# Patient Record
Sex: Female | Born: 1935 | Race: White | Hispanic: No
Health system: Southern US, Community
[De-identification: ages and names within clinical notes are randomized; demographics above are authoritative.]

---

## 1998-03-18 ENCOUNTER — Inpatient Hospital Stay (HOSPITAL_COMMUNITY): Admission: EM | Admit: 1998-03-18 | Discharge: 1998-03-24 | Payer: Self-pay | Admitting: Emergency Medicine

## 1998-03-18 ENCOUNTER — Encounter: Payer: Self-pay | Admitting: Emergency Medicine

## 1998-03-19 ENCOUNTER — Encounter: Payer: Self-pay | Admitting: *Deleted

## 1999-02-11 ENCOUNTER — Encounter: Payer: Self-pay | Admitting: Emergency Medicine

## 1999-02-11 ENCOUNTER — Inpatient Hospital Stay (HOSPITAL_COMMUNITY): Admission: EM | Admit: 1999-02-11 | Discharge: 1999-02-12 | Payer: Self-pay | Admitting: Emergency Medicine

## 1999-05-31 ENCOUNTER — Encounter: Payer: Self-pay | Admitting: *Deleted

## 1999-05-31 ENCOUNTER — Inpatient Hospital Stay (HOSPITAL_COMMUNITY): Admission: EM | Admit: 1999-05-31 | Discharge: 1999-05-31 | Payer: Self-pay | Admitting: Emergency Medicine

## 1999-06-01 ENCOUNTER — Encounter: Payer: Self-pay | Admitting: Cardiology

## 1999-06-01 ENCOUNTER — Emergency Department (HOSPITAL_COMMUNITY): Admission: EM | Admit: 1999-06-01 | Discharge: 1999-06-01 | Payer: Self-pay | Admitting: Emergency Medicine

## 1999-11-26 ENCOUNTER — Encounter: Payer: Self-pay | Admitting: Orthopedic Surgery

## 1999-11-28 ENCOUNTER — Inpatient Hospital Stay (HOSPITAL_COMMUNITY): Admission: RE | Admit: 1999-11-28 | Discharge: 1999-12-03 | Payer: Self-pay | Admitting: Orthopedic Surgery

## 2000-12-25 ENCOUNTER — Encounter (HOSPITAL_BASED_OUTPATIENT_CLINIC_OR_DEPARTMENT_OTHER): Payer: Self-pay | Admitting: Internal Medicine

## 2000-12-25 ENCOUNTER — Encounter: Admission: RE | Admit: 2000-12-25 | Discharge: 2000-12-25 | Payer: Self-pay | Admitting: Internal Medicine

## 2001-04-23 ENCOUNTER — Inpatient Hospital Stay (HOSPITAL_COMMUNITY): Admission: EM | Admit: 2001-04-23 | Discharge: 2001-04-26 | Payer: Self-pay | Admitting: Emergency Medicine

## 2001-04-23 ENCOUNTER — Encounter: Payer: Self-pay | Admitting: Emergency Medicine

## 2001-04-24 ENCOUNTER — Encounter: Payer: Self-pay | Admitting: *Deleted

## 2002-04-27 ENCOUNTER — Inpatient Hospital Stay (HOSPITAL_COMMUNITY): Admission: EM | Admit: 2002-04-27 | Discharge: 2002-05-07 | Payer: Self-pay | Admitting: Emergency Medicine

## 2002-04-27 ENCOUNTER — Encounter (HOSPITAL_BASED_OUTPATIENT_CLINIC_OR_DEPARTMENT_OTHER): Payer: Self-pay | Admitting: Internal Medicine

## 2002-04-27 ENCOUNTER — Encounter: Payer: Self-pay | Admitting: General Surgery

## 2002-04-27 ENCOUNTER — Encounter: Admission: RE | Admit: 2002-04-27 | Discharge: 2002-04-27 | Payer: Self-pay | Admitting: Internal Medicine

## 2002-04-28 ENCOUNTER — Encounter: Payer: Self-pay | Admitting: General Surgery

## 2002-04-28 ENCOUNTER — Encounter (INDEPENDENT_AMBULATORY_CARE_PROVIDER_SITE_OTHER): Payer: Self-pay | Admitting: *Deleted

## 2002-05-31 ENCOUNTER — Ambulatory Visit (HOSPITAL_COMMUNITY): Admission: RE | Admit: 2002-05-31 | Discharge: 2002-05-31 | Payer: Self-pay | Admitting: General Surgery

## 2002-05-31 ENCOUNTER — Encounter: Payer: Self-pay | Admitting: General Surgery

## 2002-11-16 ENCOUNTER — Emergency Department (HOSPITAL_COMMUNITY): Admission: EM | Admit: 2002-11-16 | Discharge: 2002-11-16 | Payer: Self-pay | Admitting: Emergency Medicine

## 2003-06-15 ENCOUNTER — Inpatient Hospital Stay (HOSPITAL_COMMUNITY): Admission: EM | Admit: 2003-06-15 | Discharge: 2003-06-22 | Payer: Self-pay | Admitting: Emergency Medicine

## 2004-12-31 ENCOUNTER — Inpatient Hospital Stay (HOSPITAL_COMMUNITY): Admission: RE | Admit: 2004-12-31 | Discharge: 2005-01-01 | Payer: Self-pay | Admitting: Cardiovascular Disease

## 2005-05-10 ENCOUNTER — Emergency Department (HOSPITAL_COMMUNITY): Admission: EM | Admit: 2005-05-10 | Discharge: 2005-05-11 | Payer: Self-pay | Admitting: Emergency Medicine

## 2005-11-26 ENCOUNTER — Inpatient Hospital Stay (HOSPITAL_COMMUNITY): Admission: AD | Admit: 2005-11-26 | Discharge: 2005-11-29 | Payer: Self-pay | Admitting: Internal Medicine

## 2005-11-26 ENCOUNTER — Encounter (HOSPITAL_BASED_OUTPATIENT_CLINIC_OR_DEPARTMENT_OTHER): Payer: Self-pay | Admitting: Internal Medicine

## 2005-11-27 ENCOUNTER — Encounter (INDEPENDENT_AMBULATORY_CARE_PROVIDER_SITE_OTHER): Payer: Self-pay | Admitting: *Deleted

## 2005-11-29 ENCOUNTER — Encounter (INDEPENDENT_AMBULATORY_CARE_PROVIDER_SITE_OTHER): Payer: Self-pay | Admitting: *Deleted

## 2006-09-24 ENCOUNTER — Inpatient Hospital Stay (HOSPITAL_COMMUNITY): Admission: EM | Admit: 2006-09-24 | Discharge: 2006-09-26 | Payer: Self-pay | Admitting: *Deleted

## 2007-04-29 ENCOUNTER — Ambulatory Visit (HOSPITAL_COMMUNITY): Admission: RE | Admit: 2007-04-29 | Discharge: 2007-04-29 | Payer: Self-pay | Admitting: Internal Medicine

## 2009-12-22 ENCOUNTER — Ambulatory Visit (HOSPITAL_COMMUNITY)
Admission: RE | Admit: 2009-12-22 | Discharge: 2009-12-22 | Payer: Self-pay | Source: Home / Self Care | Admitting: Internal Medicine

## 2010-03-21 ENCOUNTER — Ambulatory Visit (HOSPITAL_BASED_OUTPATIENT_CLINIC_OR_DEPARTMENT_OTHER)
Admission: RE | Admit: 2010-03-21 | Discharge: 2010-03-21 | Disposition: A | Discharge status: Home / Self Care | Payer: PRIVATE HEALTH INSURANCE | Attending: Orthopedic Surgery | Admitting: Orthopedic Surgery

## 2010-03-21 DIAGNOSIS — I252 Old myocardial infarction: Secondary | ICD-10-CM | POA: Insufficient documentation

## 2010-03-21 DIAGNOSIS — J449 Chronic obstructive pulmonary disease, unspecified: Secondary | ICD-10-CM | POA: Insufficient documentation

## 2010-03-21 DIAGNOSIS — I10 Essential (primary) hypertension: Secondary | ICD-10-CM | POA: Insufficient documentation

## 2010-03-21 DIAGNOSIS — J4489 Other specified chronic obstructive pulmonary disease: Secondary | ICD-10-CM | POA: Insufficient documentation

## 2010-03-21 DIAGNOSIS — G5620 Lesion of ulnar nerve, unspecified upper limb: Secondary | ICD-10-CM | POA: Insufficient documentation

## 2010-03-21 DIAGNOSIS — E039 Hypothyroidism, unspecified: Secondary | ICD-10-CM | POA: Insufficient documentation

## 2010-03-21 DIAGNOSIS — I4891 Unspecified atrial fibrillation: Secondary | ICD-10-CM | POA: Insufficient documentation

## 2010-03-21 DIAGNOSIS — E119 Type 2 diabetes mellitus without complications: Secondary | ICD-10-CM | POA: Insufficient documentation

## 2010-03-21 DIAGNOSIS — G5600 Carpal tunnel syndrome, unspecified upper limb: Secondary | ICD-10-CM | POA: Insufficient documentation

## 2010-03-21 LAB — POCT I-STAT, CHEM 8
Calcium, Ion: 1.36 mmol/L — ABNORMAL HIGH (ref 1.12–1.32)
Chloride: 105 mEq/L (ref 96–112)
HCT: 43 % (ref 36.0–46.0)
Potassium: 4.2 mEq/L (ref 3.5–5.1)
Sodium: 138 mEq/L (ref 135–145)

## 2010-03-21 LAB — GLUCOSE, CAPILLARY: Glucose-Capillary: 99 mg/dL (ref 70–99)

## 2010-04-09 NOTE — Op Note (Signed)
NAMEJOANNE, SALAH                  ACCOUNT NO.:  [id number]  MEDICAL RECORD NO.:  [id number]          PATIENT TYPE:  AMB  LOCATION:  DSC                          FACILITY:  [facility]  PHYSICIAN:  Cindee Salt, M.D.       DATE OF BIRTH:  07/16/1910  DATE OF PROCEDURE:  03/21/2010 DATE OF DISCHARGE:                              OPERATIVE REPORT   PREOPERATIVE DIAGNOSIS:  Carpal tunnel, cubital tunnel syndrome right arm, right hand.  POSTOPERATIVE DIAGNOSIS:  Carpal tunnel, cubital tunnel syndrome right arm, right hand.  OPERATION:  Decompression median ulnar nerve at the wrist and elbow.  SURGEON:  Cindee Salt, MD  ANESTHESIA:  Supraclavicular block.  ANESTHESIOLOGIST:  Zenon Mayo, MD  HISTORY:  The patient is a 77 year old female with a history of numbness and tingling of her right arm.  She has undergone nerve conductions revealing an ulnar neuropathy at her elbow and a carpal tunnel syndrome with significant changes in both areas at her wrist and elbow.  She has elected to undergo surgical decompression.  Pre, peri, and postoperative course have been discussed along with risks and complications.  She is aware that there is no guarantee with surgery, possibility of infection, recurrence injury to arteries, nerves, tendons, incomplete relief of symptoms, and dystrophy.  In the preoperative area, the patient is seen, the extremity marked by both the patient and surgeon.  Antibiotic given.  PROCEDURE:  The patient was brought to the operating room where a supraclavicular block was carried out without difficulty.  She was prepped using ChloraPrep, supine position with right arm free.  Time-out taken confirming the patient and procedure.  DVT precautions were taken. After adequate anesthesia was afforded, the limb was exsanguinated with an Esmarch bandage.  Tourniquet placed high and the arm was inflated to 250 mmHg.  A longitudinal incision was made in the palm, carried  down through subcutaneous tissue.  Bleeders were electrocauterized with bipolar.  Palmar fascia was split.  Superficial palmar arch identified. The flexor tendon to the ring and little finger identified to the ulnar side of median nerve.  The carpal retinaculum was incised with sharp dissection.  Right-angle and Sewall retractor were placed between skin and forearm fascia.  Fascia was released for approximately 1.5 cm proximal to the wrist crease under direct vision.  Canal was explored. Area compression to the nerve was apparent.  Tenosynovium tissue was mildly thickened.  The wound was copiously irrigated with saline.  The skin closed with interrupted 5-0 Vicryl Rapide sutures.  A separate incision was then made over the medial epicondyle, right elbow, carried down through subcutaneous tissue.  Bleeders again electrocauterized with bipolar.  Dissection carried down to the medial epicondyle.  The posterior aspect of Osborne's fascia was then released revealing the ulnar nerve.  A fasciotomy was then performed to the flexor carpi ulnaris muscle.  After the subcutaneous tissue was dissected free, 2 knee retractors were placed allowing visualization.  The fascia split, the muscle was then split with blunt dissection.  A KMI carpal tunnel release retractor placed between the ulnar nerve and the superficial fascia and  the fascia over the nerve was then released with angled ENT scissors.  This released at approximately 6 cm distally.  The dissection was then carried proximally, again the subcutaneous tissue was dissected free from the underlying fascia.  The two knee retractors placed allowing visualization of the fascia.  A fasciotomy was then performed proximally including the deep fascia with protection with a KMI retractor.  The nerve was released proximally and distally to the level of the arcade of Struthers proximally.  The elbow was placed through full flexion.  No subluxation to the  nerve was apparent.  The wound was copiously irrigated with saline.  Osborne's fascia was then sutured to the posterior skin flap, creating a barrier to anterior subluxation. This was performed with 2-0 Vicryl sutures.  The remaining subcutaneous tissue was closed with interrupted 2-0 Vicryl and the skin with subcuticular 4-0 Vicryl Rapide suture.  A sterile compressive dressing, long-arm splint with the elbow flexed approximately 30 degrees, wrist in neutral position was applied.  The fingers were left free.  On deflation of the tourniquet, all fingers immediately pinked.  She was taken to the recovery room for observation in satisfactory condition.  She will be discharged home to return to the [facility] [place] in 1 week on Ultram.          ______________________________ Cindee Salt, M.D.     GK/MEDQ  D:  03/21/2010  T:  03/22/2010  Job:  [id number]  cc:   Barry Dienes. Eloise Harman, M.D.  Electronically Signed by Cindee Salt M.D. on 04/09/2010 02:24:25 PM

## 2010-05-17 ENCOUNTER — Other Ambulatory Visit (HOSPITAL_BASED_OUTPATIENT_CLINIC_OR_DEPARTMENT_OTHER): Payer: Self-pay | Admitting: Internal Medicine

## 2010-05-17 ENCOUNTER — Ambulatory Visit (HOSPITAL_COMMUNITY)
Admission: RE | Admit: 2010-05-17 | Discharge: 2010-05-17 | Disposition: A | Discharge status: Home / Self Care | Payer: PRIVATE HEALTH INSURANCE | Source: Ambulatory Visit | Attending: Internal Medicine | Admitting: Internal Medicine

## 2010-05-17 DIAGNOSIS — J189 Pneumonia, unspecified organism: Secondary | ICD-10-CM | POA: Insufficient documentation

## 2010-07-03 NOTE — Op Note (Signed)
NAMECATHYANN, KILFOYLE                  ACCOUNT NO.:  [id number]   MEDICAL RECORD NO.:  [id number]          PATIENT TYPE:  INP   LOCATION:  5025                         FACILITY:  [facility]   PHYSICIAN:  Harvie Junior, M.D.   DATE OF BIRTH:  12-Oct-1910   DATE OF PROCEDURE:  09/24/2006  DATE OF DISCHARGE:                               OPERATIVE REPORT   PREOPERATIVE DIAGNOSIS:  Subtrochanteric hip fracture, right.   POSTOPERATIVE DIAGNOSIS:  Subtrochanteric hip fracture, right.   PROCEDURE PERFORMED:  Open reduction/internal fixation of  subtrochanteric hip fracture with a Stryker intramedullary rod, an 11 x  320 mm rod with a 95 mm interlocking screw with static locking  proximally and static locking distally with two interlocking screws.   SURGEON:  Harvie Junior, M.D.   ASSISTANT:  Marshia Ly, P.A.   ANESTHESIA:  General.   BRIEF HISTORY:  Ms. Bui is a 77 year old female with a long history of  having had a fall.  She suffered a subtrochanteric hip fracture.  We  were consulted.  Cardiology clearance was obtained and ultimately led to  her having an okay risk for surgical intervention for a subtrochanteric  hip fracture, which we were consulted for.  We talked about treatment  options and ultimately felt the most appropriate course of action for  her was going to be intramedullary rod fixation for the subtrochanteric  hip fracture.  She was brought to the operating room for this procedure.   PROCEDURE:  The patient was brought to the operating room and adequate  anesthesia was obtained with general anesthetic.  The patient was placed  on the operating room table.  She was then moved to the traction table  and all bony prominences were well padded.  Manipulative closed  reduction was undertaken.  Fluoroscopic images at this point showed an  absolute anatomic reduction of the subtrochanteric fracture and an  intramedullary rod was then placed with a small incision proximal to  the  greater trochanter.  Guide wire was advanced across the fracture.  A  pilot hole was made.  Rod was then reamed to 13.  Rod was then measured  with the measuring device and then the rod was introduced.  Proximal  locking was undertaken and then it was adequately locked.  Once this was  accomplished, the leg was abducted slightly to allow for access to the  distal femur and distal locking was undertaken in a freehand technique  distally in standard AO fashion.  At this point, the wounds were  copiously and thoroughly irrigated and suctioned dry and closed in  layers.  Final fluoroscopic imagings were made, which showed anatomic  alignment.  At this point, the sterile compressive dressings were  applied and the patient was then taken to the recovery room and she was  noted to be in satisfactory condition.  Estimated blood loss throughout  the procedure was less than 200 mL.      Harvie Junior, M.D.  Electronically Signed     JLG/MEDQ  D:  09/24/2006  T:  09/25/2006  Job:  [id number] 

## 2010-07-03 NOTE — Discharge Summary (Signed)
NAMEKEMBER, BOCH                  ACCOUNT NO.:  [id number]   MEDICAL RECORD NO.:  [id number]          PATIENT TYPE:  INP   LOCATION:  5025                         FACILITY:  [facility]   PHYSICIAN:  Barry Dienes. Eloise Harman, M.D.DATE OF BIRTH:  Mar 02, 1910   DATE OF ADMISSION:  09/24/2006  DATE OF DISCHARGE:  09/26/2006                               DISCHARGE SUMMARY   PERTINENT FINDINGS:  The patient is a 77 year old white female with  multiple medical problems.  On the day of admission, she reached for  something while using her lift chair and fell to the floor, suffering  immediate pain in the right hip.  She was transported to the [facility]  emergency room, where a comminuted fracture of the right femur in the  intertrochanteric region was diagnosed.   HOSPITAL COURSE:  She was seen by a cardiology consultant who  recommended perioperative low-dose beta blocker treatment and followed  her closely during her stay.  On September 24, 2006, she had open reduction  and internal fixation of a subtrochanteric right hip fracture, with a  Stryker intramedullary rod and interlocking screw.  She tolerated the  surgery well, with no postoperative complications.  She had mild  hyponatremia that was felt most likely due to moderate hyperglycemia and  chronic use of diuretics.  The possible role of pain was felt possible.  On the day prior to discharge, she was seen by a physical therapy  consultant.  She stood at the end of the bed with a rolling walker and  required total assistance with standing and with transfer to a recliner.   PROCEDURES:  Right hip fracture open reduction with internal fixation.   COMPLICATIONS:  None.   CONDITION ON DISCHARGE:  She has a dry cough but no shortness of breath  on nasal cannula oxygen at 2 liters per minute.  She has a somewhat  decreased appetite and constipation, but no abdominal pain.   MOST RECENT PHYSICAL EXAMINATION:  VITAL SIGNS:  Blood pressure 102/66,  pulse 70, respirations 18, temperature 97, pulse oxygen saturation 98%  on 2 liters per minute of nasal cannula oxygen.  CHEST:  Clear to auscultation.  HEART:  Regular rate and rhythm.  ABDOMEN:  Normal bowel sounds, and mild distention with tympany, but no  tenderness.  EXTREMITIES:  Without cyanosis, clubbing, or edema.  The right lateral  thigh incisions are clean, dry, and intact, showing no signs of  infection.   MOST RECENT LABORATORY TESTS:  White blood cell count 12, hemoglobin  9.7, hematocrit 29, platelets 210.  PT 16.1, INR 1.3.  Serum sodium 128,  potassium 4.8, chloride 95, carbon dioxide 27, BUN 28, creatinine 1.52,  glucose 196.  Most recent BNP test is 233.  Most recent capillary blood  glucose levels have been 308, 233, and 221.   Of note, she feels that her pain control is reasonable on her current  medical regimen.   DISCHARGE DIAGNOSES:  1. Status post fall, with right hip fracture, and open reduction and      internal fixation.  2. Hyponatremia.  3. Anemia.  4. Hyperglycemia secondary to diabetes mellitus type 2.  5. History of congestive heart failure.  6. Chronic atrial fibrillation.  7. Coronary artery disease.  8. Macular degeneration.  9. Seasonal allergic rhinitis.  10.Chronic renal insufficiency.  11.Secondary hyperparathyroidism.  12.Gout.  13.Chronic insomnia.  14.Constipation.  15.Gastroesophageal reflux disease.  16.Vitamin B12 deficiency.  17.Anxiety.   DISCHARGE MEDICATIONS:  1. Coumadin per pharmacist protocol.  Of note, her last dose was 2.5      mg on the evening prior to discharge.  2. Vicodin 5/325, 1 tab p.o. b.i.d.  3. Vicodin 5/325, 1 tab p.o. t.i.d. p.r.n. moderate to severe pain.  4. Tylenol 325 mg p.o. t.i.d. p.r.n. mild pain.  5. Ambien 10 mg p.o. at bedtime p.r.n. sleep.  6. Aspirin 81 mg daily.  7. Lasix 80 mg p.o. q.a.m., 40 mg p.o. q.p.m.  8. Aldactone 25 mg p.o. daily.  9. Allopurinol 300 mg p.o. daily.  10.Lantus  insulin 18 units subcutaneous q.a.m.  11.NovoLog sliding-scale insulin a.c. t.i.d. p.r.n. as follows:  For      fasting blood glucose less than 100, no insulin; for glucose 100-      150, give 2 units NovoLog; 151-200, give 4 units; greater than 200,      give 6 units NovoLog.  12.Claritin 10 mg p.o. daily p.r.n. nasal congestion.  13.Multivitamin 1 tab p.o. daily.  14.Rocaltrol 0.25 mcg p.o. daily.  15.Prilosec 20 mg p.o. daily.  16.Fibercon 625 mg 2 tabs p.o. daily p.r.n. mild constipation.  17.Senna S 2 tabs p.o. daily.  18.MiraLax 17 g p.o. b.i.d.  19.Milk of magnesia 30 mL p.o. daily p.r.n. constipation.  20.Vitamin B12, 1000 mcg IM once monthly.  Hold this month's dose      while on Coumadin.  21.TheraTears 2 drops each eye 3 times daily.  22.Robitussin 15 mL p.o. t.i.d. p.r.n. cough.  23.Nitroglycerin 0.4 mg sublingual p.r.n. chest pain.  24.Phenergan 25 mg p.r. t.i.d. p.r.n. nausea.  25.Dulcolax 10 mg p.r. once daily p.r.n. constipation.  26.Ativan 0.5 mg p.o. b.i.d. p.r.n. anxiety.  27.Betadine swab right hip incision and cover with gauze once daily.   DISPOSITION AND FOLLOWUP:  The patient will be transferred to the  skilled nursing facility for continued rehabilitation after her hip  fracture.  She should have a follow-up visit with Dr. Jodi Geralds in  approximately 2 weeks following discharge.  Attempts should be made to  keep all pressure off of the right heel at all times.           ______________________________  Barry Dienes Eloise Harman, M.D.     DGP/MEDQ  D:  09/26/2006  T:  09/26/2006  Job:  [id number]   cc:   Harvie Junior, M.D.  Richard A. Alanda Amass, M.D.

## 2010-07-06 NOTE — H&P (Signed)
[name], [name]                            ACCOUNT NO.:  [id number]   MEDICAL RECORD NO.:  [id number]                   PATIENT TYPE:  INP   LOCATION:  0445                                 [facility]ACILITY[facility]   PHYSICIAN:  Anselm Pancoast. Zachery Dakins, M.D.          DATE OF BIRTH:  12/02/1910   DATE OF ADMISSION:  04/27/2002  DATE OF DISCHARGE:                                HISTORY & PHYSICAL   CHIEF COMPLAINT:  Nausea and vomiting.   HISTORY OF PRESENT ILLNESS:  [name] is an 77 year old Caucasian  female referred to the [facility] emergency room from Dr.  Ivery Quale for management of her gallstones. The patient states that  about six weeks ago, she had a sore throat and was placed on Penicillin for  about three days. Then she got a significant thrush and since then, has been  on two other antibiotics. But then, she started having nausea and vomiting  and also was having diarrhea. She saw Dr. [name] and with the symptoms,  he referred her for an ultrasound that was done this morning and the  ultrasound, I think it was [facility] at [facility], was read by Dr. Audie Pinto with the  finding of multiple gallstones and distended gallbladder with dilatation of  the intrahepatic ducts and common bile duct to the ampulla. Suggest a common  duct stone and Dr. [name] was called and he suggested that the patient  come to the emergency room and he called [place] to manage the problem. When the  patient arrived to the emergency room, she was accompanied by her daughter  and really was not complaining of significant abdominal pain. She stated  that she is basically quite nauseous over the last several weeks and we  obtained a chest x-ray, EKG, lab studies, etc, and were surprised to see  that her BUN was 75. The patient voided and her urine did not appear that  concentrated. Also, her liver function studies did not show any definite  abnormality. We called [facility]  where she resides and it  appeared that they checked liver function studies about one week ago but  there was no BUN or creatinine and the last one that we can definitely  confirm was about two years ago in Dr. Norval Gable office. With this, I gave  her a liter of normal saline and then a fairly rapid D51/2NS with 20 mEq of  potassium and then now, about six hours later, her creatinine has come down  and her BUN is 69 and she has been getting a lot of thin urine. I think that  it will be okay to proceed with a laparoscopic cholecystectomy in the  morning.   PAST MEDICAL HISTORY:  She says that Dr. Alanda Amass is her Cardiologist and  he did her Pacemaker but she denies angina. Previous abdominal surgery, she  has had a lower midline  incision that did go up above her umbilicus and she  said that this was a partial hysterectomy many years ago.   MEDICATIONS:  Her usual medications are enteric coated aspirin. She is on  Celebrex 100 mg, Lasix 80 mg per day, Digoxin 0.125 mg one half tab daily, K-  Dur, Isosorbide 30 mg one a day, Toprol XL and Plavix 75 mg.   PHYSICAL EXAMINATION:  VITAL SIGNS: In the emergency room her weight is 150  pounds. Blood pressure 127/73, temperature 96.4, heart rate 70, respiratory  rate 18. Height is 5'3.  GENERAL: She is quite cooperative. Does not appear acutely ill.  SKIN: Hydration, does not appear to be as dehydrated as you would expect  with a BUN of 75. Her peripheral veins are somewhat collapsed but she has  not got extremely poor skin turgor.  HEENT: Tongue does not appear to be extremely dry.  CHEST: She has breath sounds bilaterally. Pacemaker.  CARDIAC: Normal sinus rhythm.  ABDOMEN: She is a little bloated but not acutely tender. She has a lower  midline incision. She had a bowel movement yesterday. She says that it has  been liquid but there has not been a chronic problem with constipation.  EXTREMITIES: Lower, she does not have pedal edema.   CNS: Appears physiologic.   IMPRESSION:  1. Symptomatic gallbladder.  2. Dehydration probably secondary to gallbladder and also recent sore throat     and oral antibiotics for the sore throat.  3. Rule out digitalis toxicity.   PLAN:  I have not placed her on antibiotics since her white count is normal  and she is not acutely tender but we are rapidly rehydrating her and I will  repeat a C-met in the morning. If this is improved and her digitalis level  is not in a toxic range, will proceed on with a laparoscopic cholecystectomy  and cholangiogram. I have discussed with her daughter that the x-ray  questioned a common bile duct stone but her liver function studies are not  elevated and hopefully, this is just a kind of a dilated common bile duct,  secondary to her age of 89 years. The patient is certainly cooperative and  understands the planned procedure and has been added onto the OR schedule  for 8:30 a.m., tomorrow morning, Wednesday morning.                                                 Anselm Pancoast. Zachery Dakins, M.D.    WJW/MEDQ  D:  04/27/2002  T:  04/28/2002  Job:  [id number]

## 2010-07-06 NOTE — Discharge Summary (Signed)
[place]. [facility]  Patient:    [name], [name]                         MRN: [id number] Adm. Date:  [id number] Attending:  Milly Jakob Dictator:   Currie Paris. Orma Flaming, P.A. CC:         Richard A. Alanda Amass, M.D.  Barry Dienes Eloise Harman, M.D.   Discharge Summary  ADMISSION DIAGNOSES: 1. End-stage degenerative joint disease, left knee. 2. Coronary artery disease. 3. Hypothyroidism. 4. Chronic atrial fibrillation. 5. Hypertension.  DISCHARGE DIAGNOSES: 1. End-stage degenerative joint disease, left knee. 2. Coronary artery disease. 3. Hypothyroidism. 4. Chronic atrial fibrillation. 5. Hypertension.  PROCEDURES IN HOSPITAL: Left total knee arthroplasty by Jodi Geralds, M.D.  CONSULTATIONS:  Cardiology consult, Dr. Alanda Amass.  BRIEF HISTORY:  [name] is a pleasant, 77 year old, female who we have followed for quite some time with a long history of left knee pain. She has pain with ambulation and positive night pain.  Plain standing x-rays of the left knee showed bone on bone arthritis within the left knee joint.  She got no relief with exhaustive conservative treatment including medication, modification of her activities and multiple cortisone injections.  Since she has not improved with exhaustive conservative treatments she was felt to be a candidate for a left total knee arthroplasty and is admitted for this.  PERTINENT LABORATORY DATA:  The patients hemoglobin on admission is 13.6 and hematocrit 39.1.  On postoperative day one, her hemoglobin was 10.2. Postoperative day two her hemoglobin was 9.9. Postoperative day three hemoglobin was 8.9.  Prothrombin time on admission was 12.6 seconds and INR of 1.0, PTT 25.  On the date of discharge her prothrombin time was 22.1 seconds with INR of 2.8.  This was on Coumadin antithrombotic therapy.  Her CMET on admission was within normal limits other than slightly decreased albumin at 3.4 and elevated glucose  at 126.  This remained stable postoperatively as well.  Her TSH was 0.081.  Free T4 was 1.95 and free T3 was 2.4.  Digoxin level was 0.9.  Urinalysis on admission showed 11 to 20 WBCs per high powered field.  Urine culture showed no growth.  EKG preoperatively showed atrial flutter with a competing junctional pacemaker, left axis deviation, nonspecific intraventricular block.  Chest x-ray preoperatively showed stable cardiomegaly with no active lung disease.  Permanent pacemaker remains.  HOSPITAL COURSE:  The patient was admitted to [facility]  She has preoperatively been worked up from a cardiology view point by Dr. Alanda Amass and was felt to be stable for surgery.  She underwent a left total knee arthroplasty on November 28, 1999 and tolerated the procedure well. Postoperatively she was put on Ancef 1 gm IV q.12h. x48 hours.  PCA morphine pump was used for pain control.  Physical therapy was ordered for walker ambulation, weight bearing as tolerated on the left.  Cardiologist, evaluated the patient and she was felt to be stable.  Preoperatively, the patients Plavix and aspirin were discontinued as she was to be placed on Coumadin postoperatively for DVT prophylaxis.  Postoperatively, she had no significant medical complications whatsoever and she did very nicely.  On postoperative day one she had no complaints.  Her vital signs were stable.  She was afebrile.  Her lungs were clear to auscultation.  Her cardiac status was stable.  Her hemoglobin was 10.2.  Her prothrombin time was 13.7 seconds and INR of 1.2.  Intact left lower extremity.  CPM machine was used for range of motion. On postoperative day two, the patients left knee dressing was changed and her wound was benign.  The calf was soft.  She did have some mild nausea. Her vital signs remained stable.  Her IV was converted to a saline lock and the Foley catheter was kept until postoperative day three.  Her Hemovac  drain was pulled on postoperative day two.  She continued to progress nicely with physical therapy without complication.  She had no medical problems whatsoever during this hospitalization.  She had very much the cardiology assistance.  On postoperative day five, the patient was doing well.  She was without complaint. She was alert and oriented.  She was taking fluids and voiding without difficulty.  She was progressing nicely with physical therapy. Her temperature is 100.6. Vital signs are stable.  Her left knee dressing was clean and dry. Her wound was benign.   Pulse was intact to the left lower extremity.  Her prothrombin time was 22.2 seconds with an INR of 2.8.  She is discharged to [facility] in [place].  DIET:  Regular.  ACTIVITY:  Weight bearing as tolerated on the left with a walker.  We would like to use a CPM machine starting at 0 degrees to 65 degrees increasing 5 to 10 degrees daily until she gets the knee to 90 degrees of flexion.  DISCHARGE MEDICATIONS:  All preoperative medications except for Plavix and aspirin.  We will add Coumadin for DVT prophylaxis, shooting for an INR between 1.5 and 2.0 per pharmacy protocol.  We would like her on Coumadin times one month postoperative.  She will also be given Tylox one tablet q.4-6h. p.r.n. pain and Flexeril 10 mg one q.h.s. p.r.n. for spasm and sleep.  INSTRUCTIONS:  She will need a dressing change of the left knee every other wound and the wound needs to be painted with Betadine.  CONDITION ON DISCHARGE:  Improved.  FOLLOW-UP: She will follow up with Dr. Luiz Blare in ten days and Dr. Alanda Amass at her previously scheduled routine appointment with him.  Notify us should any problems occur. DD:  12/03/99 TD:  12/03/99 Job: [id number]

## 2010-07-06 NOTE — Consult Note (Signed)
[name]                  ACCOUNT NO.:  [id number]   MEDICAL RECORD NO.:  [id number]          PATIENT TYPE:  INP   LOCATION:  4735                         FACILITY:  [facility]   PHYSICIAN:  Alfonse Ras, MD   DATE OF BIRTH:  10-26-1910   DATE OF CONSULTATION:  11/27/2005  DATE OF DISCHARGE:                                   CONSULTATION   CONSULTING SURGEON:  Alfonse Ras, M.D.   REQUESTING PHYSICIAN:  Barry Dienes. Eloise Harman, M.D.   CARDIOLOGIST:  [facility].   REASON FOR CONSULTATION:  Left thyroid mass with supraclavicular enlarged  lymph nodes.   HISTORY OF PRESENT ILLNESS:  [name] is a 77 year old female patient with  history of atrial fibrillation who has refused Coumadin in the past.  She  also has a pacemaker, prior history of CAD and CHF who was admitted on  11/26/2005 due to swelling of the face and neck for 1 week.  The patient  describes the onset about 1 week ago.  Subtle in nature to me.  She denies  any shortness of breath or dysphasia associated with swelling and she has  had no chronic problems related with dysphasia or shortness of breath or  swelling.  She did report to the admitting physician that she had some mild  orthopnea and dyspnea when supine with the worst of the swelling.  She has  undergone an evaluation here because of the facial swelling. CT of the chest  and neck was done and that revealed a 2.4 cm mass and enlarged lymph nodes  in the carotid chain with the largest being a 1.1 x 1.4 cm left infrahyoid  lymph node. Her swelling has decreased since admission and she is in the  process of being worked up for a pulmonary embolus secondary to complaints  of pleuritic chest pain upon presentation. Surgical evaluation has been  requested regarding the thyroid mass.   REVIEW OF SYSTEMS:  As above, otherwise noncontributory.   PAST MEDICAL HISTORY:  1. Atrial fibrillation not on Coumadin.  2. Peripheral neuropathy with  associated ataxia.  3. Gouty arthritis.  4. CHF with EF 45-50%.  5. Dyslipidemia.  6. CAD stable.  7. Anemia.  8. Overactive bladder.  9. Macular degeneration.   PAST SURGICAL HISTORY:  1. Attempted Lap chole with subsequent open cholecystectomy.  2. Appendectomy.  3. Pacemaker.  4. Total abdominal hysterectomy.  5. Total knee replacement.  6. Left hip fracture with ORIF.   SOCIAL HISTORY:  She is a DNR.  She lives at home. No alcohol.  No tobacco.   FAMILY HISTORY:  Noncontributory.   ALLERGIES:  PENICILLIN, CODEINE which causes significant confusion.   CURRENT MEDICATIONS:  Please see the patient's medication reconciliation  sheet for home medications. While hospitalized she is on Tylenol, Omega-3  fatty acids, Lotemax, Restasis eye drops, Senokot, MiraLax, Lyrica, aspirin,  calcitriol Zyloprim, Lasix, spironolactone, Ambien, Artificial tears,  heparin, Lasix, Protonix, p.r.n. nitroglycerin.   PHYSICAL EXAMINATION:  GENERAL:  Pleasant female patient complaining of  recent problems related to facial and neck  swelling. VITAL SIGNS:  Temperature 97.8, BP 120/70, pulse 75 and slightly regular, respirations 18.  NEURO:  The patient is alert, oriented x3 moving all extremities x4.  No  focal deficits.  HEENT:  Head is normocephalic.  There is residual edema noted about the face  and neck, more prominent on the left neck.  She also has a palpable fullness  in the left neck without any appreciable adenopathy or definite mass felt.  This is located just below the angle of the jaw. There is no supraclavicular  adenopathy palpable as well.  CHEST:  Bilateral lung sounds are clear to auscultation respiratory effort  is non-labored.  CARDIAC:  S1-S2 without obvious rubs, murmurs, thrills. No gallops. Due to  the swelling in the face and neck, I am unable to appreciate if there is any  JVD.  ABDOMEN:  Soft, nontender, nondistended without hepatosplenomegaly, masses  or bruits.  She  does have a small soft, nontender easily reducible umbilical  hernia.  EXTREMITIES:  Symmetrical in appearance without significant edema, cyanosis  or clubbing.   LABORATORY:  TSH is 1.266.  White count to 6200, hemoglobin 13, platelet  count 215,000.  PT 14.3 INR 1.1.  Sodium 131, potassium 4.4, CO2 29, glucose  241, BUN 29, creatinine 1.6 calcium is 10.2.   Diagnostic CT of the neck and chest again revealed a 2.4 cm left thyroid  mass with multiple left carotid chain adenopathy with the largest being a  1.1 x 1.4 cm node in the left infrahyoid region. Thyroid ultrasound is  recommended.   IMPRESSION:  1. Thyroid mass with adenopathy.  Normal TSH,  normal calcium.  2. Pulmonary embolus workup in progress.  3. Coronary artery disease, asymptomatic.  4. Congestive heart failure, compensated.  5. Atrial fibrillation not on Coumadin.  6. Hyperglycemia.   PLAN:  After discussion with the patient and daughter. the patient at this  point is declining a surgical intervention to i.e. any thyroidectomy-type  procedure, but she would like a biopsy for nonsurgical treatment.  Radiologist is  recommending at least a thyroid ultrasound and possible needle aspiration.  We will go ahead and order an ultrasound of the thyroid to evaluate to the  thyroid mass and lymph node enlargement and I will discuss with Dr. Colin Benton  whether if as surgeons we will perform these procedures or defer to  Interventional Radiology.      [name]      Alfonse Ras, MD  Electronically Signed    ALE/MEDQ  D:  11/27/2005  T:  11/28/2005  Job:  [id number]

## 2010-07-06 NOTE — Discharge Summary (Signed)
[name], [name]                            ACCOUNT NO.:  [id number]   MEDICAL RECORD NO.:  [id number]                   PATIENT TYPE:  INP   LOCATION:  0343                                 FACILITY:  [facility]   PHYSICIAN:  Anselm Pancoast. Zachery Dakins, M.D.          DATE OF BIRTH:  05/01/1910   DATE OF ADMISSION:  04/27/2002  DATE OF DISCHARGE:                                 DISCHARGE SUMMARY   DISCHARGE DIAGNOSES:  1. Subacute cholecystitis with common duct stone and ascending cholangitis.  2. Severe dehydration secondary to nausea and vomiting.  3. History of coronary artery disease with atrial fibrillation and has     pacemaker.  4. Degenerative arthritis, history of gout.  5. Hyperkalemia.   OPERATION:  Laparoscopic attempt and then conversion to an open  cholecystectomy common duct exploration, T tube and choledochoscopy.   HISTORY:  [name] is an 77 year old Caucasian female who was referred to  the ER at [facility] by  Dr. Jarome Matin, her regular physician, for management of about a six  week period of nausea and vomiting and an ultrasound had been obtained that  showed gallstones. She stated that she had had an infected throat and was  started on antibiotics, I think originally it was Augmentin for about three  days about six weeks earlier and then got into problems with extremely sore  throat thought possibly to have a reaction whether it was a fungal,  secondary or whatever I am not sure but the antibiotics were changed. She  continued to complain of nausea and vomiting and felt that she was getting  weaker and for this she was evaluated with an ultrasound that showed  gallstones. They recommended that she come to the emergency room and I was  on call and saw her in the emergency room and on physical examination I was  surprised in that she was not acutely tender but she was kind of mildly  bloated. On laboratory studies, her white count was not elevated but her  BUN  was 75 with a creatinine of 2.6. Her calcium was elevated at 11.4 and her  electrolytes were normal, potassium was 5.2. She had been on oral supplement  and her hematocrit was 12.7 with a white blood count of 6500. I started  giving her fluid boluses, did place her on Cipro and placed a Foley  catheter. She was given a liter bolus in the emergency room and started  making a moderate amount of urine and I elected not to take her to the  operating room initially because of this marked azotemia which I thought was  probably secondary to basically dehydration. Her chronic medications she had  on Lasix 80 mg a day for her coronary artery problems and pacemaker and this  combined with the nausea, vomiting and basically inability to eat I think it  lead to the significant dehydration. After about six  hours, repeat labs were  obtained, a BUN was 69, creatinine was decreased to 1.9 and this obviously  confirmed that she was hypovolemic. The following morning, her BUN was  continuing to decrease and I elected to proceed on with surgery. Hopefully  we were able to do it with a laparoscope. She had had a hysterectomy years  ago through a very generous lower midline incision that went to the mid  upper abdomen. She did not have any actual details of why her incision was  so high, etc. and I think that had been her only previous abdominal surgery.  However at the time of surgery, she had extensive adhesions throughout  generalized in her abdomen and trying to get into the upper abdomen in a  free space so we could do the laparoscopic gallbladder.  There was a little  injury. The camera touched the small bowel and a little burn area and I  converted her to an open cholecystectomy. She had extensive adhesions, a  very difficult gallbladder because of the adhesions. The gallbladder was  subacutely inflamed, thickened and the T tube cholangiogram showed a very  dilated extrahepatic biliary system. I  could not definitely see a free  stone. The radiologist was not sure whether it was spasm or what and since  we had done an open, I elected to go ahead and do a common duct exploration,  used a biliary Fogarty that kind of pushed through the ampulla area, could  not actually see a definite stone but then we were getting flow of dye into  the duodenum. A T tube was placed and postoperatively she was making an  adequate amount of urine but then had a problem with hyperkalemia and we did  transfer her down to a monitor bed. She was originally not on monitor but  had no cardiac arrhythmias and etc. She had a nasogastric tube for  approximately three days. I had placed a few stitches in the little area  where the small bowel had been basically burned and then she started having  gas, removed the NG tube, started her on a diet and she progressed nicely.  The bile in the common bile duct at the time of surgery was frankly purulent  and the cultures of this grew a Klebsiella pneumonia sensitive to Unasyn and  most of the antibiotics not sensitive to ampicillin. She has done nicely.  Her BUN and creatinine have returned to a normal level. Her creatinine this  morning is 0.8, her BUN is 11, total bilirubin is 0.9. The T tube has been  clamped for 48 hours and her liver tests are slightly abnormal as you  usually see with a T tube in place. Alkaline phosphatase 166, SGOT 69, SGPT  66. Her hemoglobin is 20, hematocrit 25.6, white count 7600 and I think this  is somewhat delusional. I think she is probably a little low hemoglobin that  would appear to be normal on admission just because she was so dehydrated  and this will be followed in the office. I am weighing her at this time and  would recommend that 1) we probably resume the Lasix at about 40 mg either  daily or every other day according to her weight. Her blood pressure has never been significantly elevated and we will see her in the office in  one  week. If she would have fever or reoccurring abdominal pain, the T tube will  be opened and connected to the bile  bag and I will have them check a CMET  probably Tuesday or Wednesday of next week at [facility] and fax  me a report of that and I am planning to see her in the office towards the  end of next with week. How long she is actually going to be in the nursing  home before she returns to her regular home I am not sure, but we plan on  getting a T tube cholangiogram in about 10 days and if the T tube  cholangiogram is normal, I will remove the T tube in the office.                                               Anselm Pancoast. Zachery Dakins, M.D.    WJW/MEDQ  D:  05/07/2002  T:  05/07/2002  Job:  [id number]   cc:   Barry Dienes. Eloise Harman, M.D.  [address]  [place]  [place] [address]  Fax: [phone]   Richard A. Alanda Amass, M.D.  [id number]  [place]  [place] [address]  Fax: [phone]

## 2010-07-06 NOTE — Consult Note (Signed)
[name], [name]                            ACCOUNT NO.:  [id number]   MEDICAL RECORD NO.:  [id number]                   PATIENT TYPE:  INP   LOCATION:  5031                                 FACILITY:  [facility]   PHYSICIAN:  John C. Madilyn Fireman, M.D.                 DATE OF BIRTH:  17-Nov-1910   DATE OF CONSULTATION:  06/21/2003  DATE OF DISCHARGE:                                   CONSULTATION   REASON FOR CONSULTATION:  Elevated liver function tests.   HISTORY OF PRESENT ILLNESS:  The patient is a 77 year old white female who  was admitted with hip fracture and underwent surgery on June 15, 2003.  She  had an ALT of 62, AST of 54 preoperatively, but on June 18, 2003, had an  ALT of 618, AST of 109.9.  On Jun 19, 2003, her ALT and AST were 296 and 393,  respectively; on Jun 21, 2003, they were 247 and 180, respectively, all the  time with normal bilirubin and alkaline phosphatase.  She has not had any  abdominal pain.  She had an abdominal ultrasound which showed a dilated  common bile duct of 17 mm without any obvious mass or stone.  MRCP is  ordered.  The patient did undergo cholecystectomy in April of 2004, and was  noted to have some dilatation of the common bile duct at the time of  intraoperative cholangiogram.  There was some suggestion of retained common  duct stone at that time.  The patient had a T-tube in and one month later,  had a negative T-tube cholangiogram prior to it being pulled.  The patient  denies any personal family history of liver disease, denies any alcohol use.  She is not sure whether she has had blood transfusion in the past and denies  any IV drug use.   PAST MEDICAL HISTORY:  1. Gout.  2. Atrial fibrillation.  3. Congestive heart failure.   PAST SURGICAL HISTORY:  1. Pacemaker implantation.  2. Cholecystectomy.   ALLERGIES:  1. PENICILLIN.  2. CODEINE.   MEDICATIONS:  1. Celebrex.  2. Neurontin.  3. Toprol.  4. Isordil.  5. Allopurinol.  6.  Spironolactone.   PHYSICAL EXAMINATION:  GENERAL APPEARANCE:  A well-developed, elderly white  female in no acute distress.  HEENT:  Sclerae are nonicteric.  LUNGS:  Clear.  CARDIOVASCULAR:  Regular rate and rhythm without murmur.  ABDOMEN:  Soft and nondistended with normal active bowel sounds.  No  hepatosplenomegaly.  No masses or guarding.   IMPRESSION:  1. Elevated liver function tests in the perioperative setting probably due     to surgery related hypoperfusion.  2. Dilated common bile duct with no clinical presentation of biliary     obstruction and doubt this is related to her elevated liver function     tests.   PLAN:  1. Will continue to follow  liver function tests and probably pursue     nonaggressive work-up as long as falling.  2. We will await MRCP to assess for any evidence of retained common duct     stones or periampullary or pancreatic tumor.                                               John C. Madilyn Fireman, M.D.    JCH/MEDQ  D:  06/21/2003  T:  06/21/2003  Job:  [id number]   cc:   Barry Dienes. Eloise Harman, M.D.  [address]  [place]  [place] [address]  Fax: [phone]

## 2010-07-06 NOTE — Discharge Summary (Signed)
[place]. [facility]  Patient:    [name], [name] Visit Number: [id number] MRN: [id number]          Service Type: EMS Location: [place] Attending Physician:  Osvaldo Human Dictated by:   Abelino Derrick, P.A.C. Admit Date:  04/23/2001 Discharge Date: 04/23/2001   CC:         Barry Dienes. Eloise Harman, M.D.   Discharge Summary  DISCHARGE DIAGNOSES: 1. Congestive heart failure, improved at discharge. 2. Mild left ventricular dysfunction with ejection fraction of 45-50%. 3. Coronary disease with total right coronary artery, left-to-right    collaterals. 4. Chronic atrial fibrillation. 5. History of PTVDP. 6. History of hypothyroidism. 7. Hypertension.  HOSPITAL COURSE:  The patient is an 77 year old female with history as noted above, who became short of breath on the day of admission.  She was admitted to telemetry.  She was given IV diuretics.  She improved symptomatically with diuresis.  ACE inhibitor was increased.  She was put on heparin.  We feel she could be discharged April 26, 2001, and will follow up with Dr. Alanda Amass.  DISCHARGE MEDICATIONS:  1. Altace 5 mg a day.  2. Lasix 80 mg twice a day for three days, then decreased to 80 mg in the     morning and 40 mg at night.  3. Potassium 20 mEq twice a day for three days and then once a day.  4. Lanoxin 0.125 mg one-half tablet a day.  5. Celebrex as taken at home.  6. Imdur 30 mg a day.  7. Plavix 75 mg a day.  8. Aspirin 81 mg a day.  9. Toprol XL 25 mg a day. 10. Allopurinol 100 mg a day. 11. Niaspan was added at discharge, 500 mg at h.s.  LABORATORY DATA:  EKG shows atrial fibrillation, ventricular pacing.  Chest x-ray shows cardiomegaly and vascular congestion on April 23, 2001.  Follow-up chest x-ray showed no acute process on March 7.  White count 10.5, hemoglobin 14.5, hematocrit 43.1, platelets 236.  Sodium 138, potassium 3.7, BUN 12, creatinine 0.9.  Liver functions showed an AST of 27,  ALT 15, ALP 141. Hemoglobin a1C is 6.0.  CK and troponins are negative.  Lipid profile showed triglycerides 366, HDL 49, LDL 67.  TSH 1.17.  B12 499.  Urinalysis unremarkable.  DISPOSITION:  The patient is discharged in stable condition.  She will get BNP Thursday after discharge and will get an echo and follow up with Dr. Alanda Amass.  Her admission weight was 164, discharge weight 156. Dictated by:   Abelino Derrick, P.A.C. Attending Physician:  Osvaldo Human DD:  04/30/01 TD:  05/02/01 Job: [id number]

## 2010-07-06 NOTE — Discharge Summary (Signed)
[name], [name]                  ACCOUNT NO.:  [id number]   MEDICAL RECORD NO.:  [id number]          PATIENT TYPE:  INP   LOCATION:  4735                         FACILITY:  [facility]   PHYSICIAN:  Barry Dienes. Eloise Harman, M.D.DATE OF BIRTH:  06-14-1910   DATE OF ADMISSION:  11/26/2005  DATE OF DISCHARGE:  11/29/2005                                 DISCHARGE SUMMARY   PERTINENT FINDINGS:  Patient is a 77 year old white female who has been a  long-term resident of the [facility].  She has multiple  medical problems.  Approximately 3 days ago, she was noted to have left neck  and supraclavicular fullness that progressed over the next 2 days to also  involve the right side.  The swelling seemed to be worse when lying supine  and was associated with mild dyspnea and mild right-sided pleuritic chest  pain.  She had not had any recent fever or chills.  She has no history of  cancer.  She does have a history of a cardiac pacemaker that was last  changed approximately 1 year ago.  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  She also has a vague  history of diabetes mellitus, type 2.   With a description of bilateral neck swelling with shortness of breath, she  was empirically started on Lovenox 40 mg subcutaneous every 12 hours on  November 23, 2005.  She was seen at the facility on October 9, and directly  admitted to the hospital.   See admission history and physical for listing of her medications prior to  admission, allergies and medication intolerances, past surgical history,  social history, family history, and review of systems.   INITIAL PHYSICAL EXAMINATION:  VITAL SIGNS:  Blood pressure 110/67, pulse  69, respirations 20, temperature 97.6.  GENERAL:  In general, she is an elderly white female who had mild facial  plethora while sitting in a wheelchair.  HEAD, EYES, EARS, NOSE AND THROAT:  Exam was otherwise unremarkable.  NECK:  Neck had a normal range of motion with  suggestive of bilateral  supraclavicular fullness and no cervical lymphadenopathy or no thyromegaly.  There was no carotid bruit and jugular venous was difficult to discern.  CHEST:  Chest had minimal bibasilar crackles.  HEART:  Heart had a regular rate and rhythm without significant murmur or  gallop.  ABDOMEN:  Abdomen had normal bowel sounds and no hepatosplenomegaly or  tenderness.  EXTREMITIES:  Extremities had bilateral 1+ feet edema that was slightly  greater on the left than the right with no evidence of a palpable venous  cord.  NEUROLOGICAL EXAM:  She was alert and well-oriented and continues to have  bilateral numbness of the legs and feet.   Most recent laboratory data included November 24, 2005, labs:  White blood  cell count 11.7  with 72% neutrophils, hemoglobin 13, hematocrit 36,  platelets 246.  Serum sodium 134, potassium 4.3, chloride 97, carbon dioxide  28, BUN 30, creatinine 1.5, glucose 151, total protein 6.8, albumin 2.9.   October 24, 2005, chest x-ray showed the following:  Cardiomegaly with  calcification and ectasia of the aorta, along with central venous  engorgement with no peripheral signs of pulmonary edema, focal infiltrate,  or atelectasis.   HOSPITAL COURSE:  The patient was admitted to a medical bed with telemetry.  On the day of admission, she had a CT scan of the neck and chest with IV  contrast that showed the following:  1) No intrathoracic mass or  lymphadenopathy with chronic interstitial prominence in both lungs, mild  cardiomegaly, and a cardiac pacemaker in the appropriate location.  The neck  scan  showed a 1.1 x 1.4-cm lymph node that was lateral to the left internal  carotid artery and 2.4 x 2.1-cm left thyroid mass with internal  calcifications.  She also had a bilateral upper extremity ultrasound exam on  October 10 that showed no evidence of venous thrombosis.  A ventilation and  perfusion scan done due to mild renal insufficiency showed low probability  for pulmonary embolism.  An October 11 thyroid and neck ultrasound showed a  lower left thyroid 1.9 cm x 1.6 cm x 1.7-cm heterogenous nodule with some  internal calcifications, for which biopsy was recommended.  An October 11  chest x-ray showed cardiomegaly with interstitial prominence.  On the day of  discharge, she is to have a fine-needle aspirate biopsy of the left neck  lymphadenopathy and thyroid mass.   She was also seen by her cardiologist and by a general surgeon.  The  cardiologist had recommended doing the upper extremity Doppler exam.  They  noted that her advanced age increased her risks for surgery.  A general  surgeon evaluated the patient and recommended biopsy of the lesion.  She  declined an open biopsy, preferring use of interventional radiology.   CONDITION ON DISCHARGE:  She had no shortness of breath and continued to  have very mild right-sided pleuritic chest pain, but no substernal chest  discomfort.   VITAL SIGNS: Most recent vital signs include a blood pressure of 100/73,  pulse 70, respirations 18, temperature 97.8, pulse oxygen saturation 94% on  room air.  GENERAL:  Her current exam shows a mildly overweight white female who was in  no apparent distress while sitting at 30 degree elevation of head of the  bed.  HEAD, EYES, EARS, NOSE AND THROAT:  Exam was within normal limits and there  was no evidence of cervical lymphadenopathy by exam.  NECK:  Without jugular venous distention or carotid bruit.  CHEST:  Clear to auscultation. HEART:  Heart had a regular rate and rhythm without significant  murmur or  gallop.  ABDOMEN: Benign.  EXTREMITIES:  Extremities had bilateral trace leg edema.  NEUROLOGIC:  She was alert and well-oriented.   Most recent laboratory tests include white blood cell count 6.6, hemoglobin  13, hematocrit 40, platelets 213.  Serum sodium 136, potassium 4.9, chloride  98, carbon dioxide 29, BUN 28, creatinine 1.5, glucose 138.  BNP 31.  TSH  1.3.  Hemoglobin A1c 7.1%.  Most recent capillary blood glucose levels on  sliding scale insulin were 150, 171, 159.  Her current weight is 182 pounds.   DISCHARGE DIAGNOSES:  1. Left-sided thyromegaly, rule out thyroid carcinoma.  2. Left-sided supraclavicular lymphadenopathy.  3.  Chronic atrial fibrillation.  4. Cardiac pacemaker dependent.  5. Idiopathic bilateral leg neuropathy with secondary ataxia.  6. Gouty arthritis.  7. History of congestive heart failure with left ventricular ejection      fraction of 45-50%.  8. Chronic renal insufficiency.  9. Secondary hyperparathyroidism.  10.Gout.  11.Macular degeneration.  12.Chronic insomnia.  13.Gastroesophageal reflux disease.  14.Dyslipidemia.  15.Vitamin B12 deficiency.  16.Dry eyes.  17.Chronic constipation.  18.Seasonal allergic rhinitis.  19.Chronic constipation.  20.Coronary artery disease.  21.Diabetes mellitus, type 2, under fair control.   DISCHARGE MEDICATIONS:  1. Lantus insulin 6 units subcutaneous every a.m.  2. Lyrica 150 mg p.o. q.a.m., 50 mg p.o. every noon, and 150 mg nightly.  3. Aspirin 81 mg daily.  4. Rocaltrol 0.25 mcg p.o. daily.  5. Allopurinol 100 mg p.o. daily.  6. Lasix 80 mg p.o. q.a.m. and 40 mg p.o. q.p.m.  7. Spironolactone 50 mg p.o. daily.  8. Cerefolin NAC 1 tab p.o. daily.  9. Ambien CR 12.5 mg p.o. nightly.  10.Prilosec 20 mg p.o. daily.  11.Omega-3 fish oil 1000 mg capsules, take 3 capsules daily.  12.Vitamin B12 1000 mcg IM every 6 weeks.  13.Lotemax 0.5% eye drops 1 drop to right eye twice daily.  14.Restasis  0.05% eye emulsion 1 drop into each eye twice daily.  15.Senna-S 2 tabs p.o. daily, hold for diarrhea.  16.MiraLax 17 grams p.o. daily p.r.n. constipation.  17.Thera Tears liquid gel 2 drops in each eye q.i.d.  18.Tylenol 500 mg p.o. daily p.r.n. pain.  19.Claritin 10 mg p.o. daily p.r.n. rhinitis.  20.Dulcolax 10 mg suppository once daily p.r.n. constipation.  21.Lacri-Lube apply a small amount to each eye nightly p.r.n. dry eyes.  22.GenTeal lubricant to both eyes q.i.d. p.r.n. dry eyes.  23.Robitussin 15 mL p.o. t.i.d. p.r.n. cough.  24.Nasal Atrovent 0.06% 2 sprays each nostril t.i.d. p.r.n. nasal      congestion.  25.Milk of magnesia 30 mL p.o. daily p.r.n. constipation.  26.Nitroglycerin 0.4 mg sublingual p.r.n. chest pain.   DISPOSITION AND FOLLOWUP:  The patient will be transferred back to the  skilled nursing facility for continued long-term care.  For the next 7 days, her capillary blood glucose level should be check a.c. t.i.d. and nightly  with the results recorded and saved for the attending physician.  At this  time, no sliding scale insulin will be administered.  Dr. Eloise Harman or his partner should be called if the biopsy area on the left  neck develops a significant hematoma or her breathing worsens.   Please note that the process of discharge required 40 minutes.           ______________________________  Barry Dienes Eloise Harman, M.D.     DGP/MEDQ  D:  11/29/2005  T:  11/29/2005  Job:  [id number]   cc:   Gerlene Burdock A. Alanda Amass, M.D.  Catherine A. Orlin Hilding, M.D.  Camille Bal, M.D.  Danna Hefty, MD

## 2010-07-06 NOTE — Consult Note (Signed)
[name], [name]                            ACCOUNT NO.:  [id number]   MEDICAL RECORD NO.:  [id number]                   PATIENT TYPE:  INP   LOCATION:  5031                                 FACILITY:  [facility]   PHYSICIAN:  Barry Dienes. Eloise Harman, M.D.            DATE OF BIRTH:  11/10/1910   DATE OF CONSULTATION:  06/15/2003  DATE OF DISCHARGE:                                   CONSULTATION   INDICATIONS FOR PROCEDURE:  Perioperative management of coronary artery  disease, atrial fibrillation, and multiple medical problems.   HISTORY OF PRESENT ILLNESS:  The patient is a 77 year old white female with  several medical problems, all of which are stable. In fact, I had last seen  her yesterday at the [facility], where she is a long standing  patient in the assisted living section. Today, while going to the bathroom,  she slipped on the floor and fell onto her left hip. She had moderately  severe pain at that point and her hip was externally rotated. She was  transported to the [facility] emergency room for evaluation. She  was admitted with a left femoral neck fracture and is awaiting surgery this  evening. Currently, she denies shortness of breath, chest pain, or  palpitations. Her left hip pain is under reasonable control on morphine  sulfate p.r.n.   PAST MEDICAL HISTORY:  Significant for severe peripheral neuropathy,  idiopathic with gait instability for which she uses a walker, gouty  arthritis, remote history of congestive heart failure, chronic atrial  fibrillation, pacemaker dependent, hyperlipidemia, pernicious anemia,  insomnia, overactive bladder, rhinitis, and hyperthyroidism. Of note, she  had hospital admission in 2002 and in March 2003 for congestive heart  failure. She also has diabetes mellitus type 2, which is diet controlled  with hemoglobin A1C levels well below 7.0%. She has coronary artery disease  with 100% occlusion of the right coronary artery on  last cardiac  catheterization.   ADMISSION MEDICATIONS:  Senna S 1 to 2 tabs p.o. q.d. p.r.n. constipation,  Aldactone 50 mg p.o. q.d., Oxytrol 1 patch twice weekly, Digoxin 0.125 mg  1/2 tab p.o. q.d., Imdur 30 mg p.o. q.d., Lasix 80 mg p.o. q. a.m. and 40 mg  p.o. q. p.m., Toprol XL 25 mg p.o. q.d., Allopurinol 100 mg p.o. q.d.,  aspirin 81 mg p.o. q.d., Colchicine 0.6 mg p.o. b.i.d., Os-Cal 500 D 1 tab  p.o. b.i.d. and Neurontin 600 mg q.h.s. and 300 mg q. a.m. This had just  been increased from 300 mg q.h.s. yesterday.   ALLERGIES:  CODEINE (associated with nausea) PENICILLIN (associated with a  rash).   CODE STATUS:  DO NOT RESUSCITATE.   POINTS OF CONTACT:  Serra Younan (son) telephone [phone] and [phone] and  Ilean China (granddaughter) telephone [phone].   PHYSICIAN'S INVOLVED IN HER CARE IN THE PAST:  Aleatha Borer,  Corliss Skains, Orlin Hilding, and [place].  PAST SURGICAL HISTORY:  Remote total abdominal hysterectomy and appendectomy  1990. Right total knee replacement December 1997. Cardiac pacemaker  implantation 2001. Cataract operation October 2001. Left total knee  replacement March 2004. Open cholecystectomy.   SOCIAL HISTORY:  She has been a widow since 5. She is a resident of the  [facility] in the assisted living section. She has 3 children. She  has no history of tobacco or alcohol abuse.   FAMILY HISTORY:  Noncontributory.   REVIEW OF SYSTEMS:  She has chronic dry eyes and currently, she has a mild  sensation that something is in her throat with difficulty clearing her  secretions. She denies recent fever, shortness of breath, chest pain,  nausea, constipation or confusion or anxiety. She has chronic numbness and  tingling of both feet with associated gait instability and she generally  uses a walker to ambulate short distances.   PHYSICAL EXAMINATION:  VITAL SIGNS:  Blood pressure 95/49, pulse 71,  respiratory rate 16, temperature 97.1,  pulse oxygen saturation 93% on room  air.  GENERAL:  She is a well developed, well nourished white female who is in no  apparent distress.  HEENT:  Within normal limits.  NECK:  Supple and without jugular venous distention or carotid bruit.  CHEST:  Clear to auscultation.  HEART:  Regular rate and rhythm. S1 and S2 are present without murmur,  gallop or rub.  ABDOMEN:  Normal bowel sounds with no hepatosplenomegaly or tenderness.  EXTREMITIES:  Were significant for a cool left foot but 1+ pedal pulses.  There is markedly decreased light touch to sensation in both feet. There was  no peripheral edema or cyanosis.  NEUROLOGIC:  She is alert and oriented x3. Cranial nerves 2-12 are intact.  There were no focal deficits other than the decreased light touch sensation  in her feet.   LABORATORY DATA:  White blood cell count 10.5, hemoglobin 14, hematocrit 44,  platelet count 231,000. Serum sodium 133, potassium 4.9, chloride 92, BUN  55, creatinine 1.8, glucose 120, total protein 7.9, albumin 3.9, calcium  11.4. Urinalysis was nitrite positive with white blood cells 3 to 6 and  bacteria many.   Electrocardiogram showed 100% ventricular paced beats.   IMPRESSION/RECOMMENDATIONS:  1. Perioperative assessment:  Her medical conditions are stable,     specifically, she has no current evidence of congestive heart failure or     unstable angina. She is at relatively low risk for perioperative     complications. I plan to re-start her usual cardiac medications when her     blood pressure is a bit higher. She is likely mildly hypotensive due to     blood loss from her hip fracture.  2. Bacteriuria:  This likely represents asymptomatic bacteriuria. I     understand that she will receive perioperative Ancef, which likely would     cover organisms in the urine. If she has fever or chills, we will submit     a urine culture to ensure that she receives the appropriate antibiotic. 3. Renal  insufficiency:  She is at approximately her baseline serum     creatinine level, which in March of 2005, was 1.5. Her fluid status will     be monitored closely while she is an inpatient.  4. Hypercalcemia:  This is new for her, as her last serum calcium level was     10.5 again in March 2005. We will check laboratory studies to ensure that  there is not a secondary cause for this that warrants additional     treatment.  5. Diabetes mellitus type 2:  This had been well controlled with monitoring     her diet. If her blood glucose levels rise with the stress of surgery and     pain, sliding scale insulin will be initiated.                                               Barry Dienes Eloise Harman, M.D.    DGP/MEDQ  D:  06/15/2003  T:  06/15/2003  Job:  [id number]   cc:   Anselm Pancoast. Zachery Dakins, M.D.  [address], [address]  [place]  [place] [address]  Fax: [phone]   Richard A. Alanda Amass, M.D.  [phone], [address]  [place]  [place] [address]  Fax: [phone]   Grandville Silos. Corliss Skains, M.D.  [address]  [place]  [place] [address]  Fax: [phone]   Gustavus Messing. Orlin Hilding, M.D.  [address]  [address]  [place]  [place] [address]  Fax: [phone]   Camille Bal, M.D.  [address]  [place]  [place] [address]  Fax: [phone]

## 2010-07-06 NOTE — Discharge Summary (Signed)
[place]  Patient:    [name], [name]                         MRN: [id number] Adm. Date:  [id number] Disc. Date: [id number] Attending:  Cathren Laine Dictator:   Darcella Gasman. Annie Paras, F.N.P.C. CC:         Lenise Herald, M.D.             Darcella Gasman. [place], F.N.P.C.             Barry Dienes Eloise Harman, M.D.                           Discharge Summary  DISCHARGE DIAGNOSES: 1. Chest pain, negative for myocardial infarction. 2. Coronary artery disease, single vessel. 3. Left ventricular dysfunction. 4. Hypertension. 5. Hypothyroidism. 6. History of dual-mode, dual-pulse and dual-sensing pacemaker.  CONDITION ON DISCHARGE:  Improved.  PROCEDURE:  On May 31, 1999, combined left heart catheterization by Dr. Lenise Herald.  DISCHARGE MEDICATIONS: 1. Imdur 30 mg one daily. 2. Pepcid 20 mg one twice a day. 3. Aspirin 81 mg daily. 4. Celebrex 100 mg p.o. b.i.d. 5. Kay Ciel 20 mEq daily. 6. Lasix 80 mg daily. 7. Plavix 75 mg daily. 8. Betapace 40 mg twice a day. 9. Lanoxin 0.125 mg daily.  SPECIAL INSTRUCTIONS:  See cardiac catheterization home care sheet.  FOLLOWUP:  Follow up with Dr. Jenne Campus on Jun 25, 1999.  HISTORY OF PRESENT ILLNESS:  This is an 77 year old white female with history of hypertension and atrial fibrillation who was recently started on betapace and Plavix, status post permanent pacemaker in 1997, and coronary artery disease, status post catheterization in February 2000, revealing right coronary disease, 100% proximal with EF 60%.  On May 31, 1999, the patient began having substernal chest pain with radiation into the right arm.  She took two nitroglycerin sublingually with some relief but never completely resolved.  She came to the emergency room and was started on IV nitroglycerin with improvement.  Initial CK was 96.  She was deemed ventricularly paced.  MEDICATIONS: 1. Aspirin 81 mg daily. 2. Celebrex 100 twice a day. 3. K-Dur  20 mEq daily. 4. Lasix 80 mg daily. 5. Plavix 75 mg daily. 6. Betapace 40 mg twice a day. 7. Lanoxin 0.125 mg daily.  ALLERGIES:  CODEINE.  REVIEW OF SYSTEMS/FAMILY HISTORY:  See HPI.  DISCHARGE PHYSICAL EXAMINATION:  Blood pressure 131/54, pulse 64, respiratory rate 20, right groin without complications.  LABORATORY DATA and X-RAY FINDINGS:  WBC 13.3, hemoglobin 12.2, hematocrit 36.5, MCV 84, platelets 327; neutrophils 81, lymphs 11, monos 6, eos 0. Sodium 136, potassium 5.1, chloride 104, CO2 26, glucose 132, BUN 31, creatinine 1.3, calcium 8.7.  Total protein 7, albumin 3.4, AST 42, ALT 29, total bilirubin 1.1.  CK 96, MB 3.6, troponin I less than 0.03.  EKG revealed ventricular pacing with underlying atrial fibrillation.  Chest x-ray report is not currently present.  On May 31, 1999, cardiac catheterization by Dr. Jenne Campus revealed significant one vessel coronary disease of the right coronary artery only occluded in proximal portion, distal RCA fields right to right and left to right collaterals, mildly depressed LV systolic function with apical and inferior apical hypokinesis, significant amount of ventricular ectopy.  HOSPITAL COURSE:  Ms. Pomerleau came to the emergency room on May 30, 1999, for chest pain.  She was started on IV heparin and IV  nitroglycerin.  She was seen by Dr. Jenne Campus who electively catheterized her on May 31, 1999. Catheterization was without without change from 2000.  CK-MB was negative and she was sent back to day hospital for discharge home from there.  Imdur was added to her medical regimen as well as Plavix and she would follow up as an outpatient with Dr. Jenne Campus. DD:  06/29/99 TD:  07/02/99 Job: [id number]

## 2010-07-06 NOTE — Op Note (Signed)
[name], [name]                  ACCOUNT NO.:  [id number]   MEDICAL RECORD NO.:  [id number]          PATIENT TYPE:  OIB   LOCATION:  2899                         FACILITY:  [facility]   PHYSICIAN:  [name], M.D.DATE OF BIRTH:  September 29, 1910   DATE OF PROCEDURE:  DATE OF DISCHARGE:                                 OPERATIVE REPORT   PROCEDURE PERFORMED:  Explantation of end of life Pacesetter Trilogy DR  model number 2360, serial number [id number].  Implantation of new St. Jude  Medical Verity ADX-XLSR single chamber generator model number [id number], serial  number [id number] using previously implanted ventricular electrode, model  number 1388 TC-52 cm; serial number [id number].  Abandoned previously implanted  atrial electrode model number 1388 TC-46 cm, serial number [id number] capped.  Temporary transvenous pacemaker percutaneous right common femoral vein, 6  French sheath removed at the end of the procedure.   IMPLANTING PHYSICIAN:  [name], M.D.   COMPLICATIONS:  None.   ESTIMATED BLOOD LOSS:  Approximately 20 mL.   ANESTHESIA:  1% local Xylocaine, fentanyl 25 mcg IV times one, 5 mg Valium  p.o. premedication.   PREOPERATIVE ANTIBIOTIC PROPHYLAXIS:  Vancomycin 1 g IV (allergic to  penicillin).   PREOPERATIVE DIAGNOSES:  1.  Sick sinus syndrome -- symptomatic bradycardia.  2.  Status post AV node ablation for atrial fibrillation, rapid ventricular      response with PTVP, [facility], Dr. Chales Abrahams, May 25, 1996. DDDR.  3.  Chronic -- permanent atrial fibrillation with reprogramming to VVIR mode      long term.  Coronary artery disease asymptomatic, status post right      coronary occlusion with left coronary left to right collaterals with      noncritical LCA disease and EF 45 to 50% at cath, April 2001.  4.  Gastroesophageal reflux disease, occasional chest discomfort atypical.  5.  Status post remote bilateral knee replacement.  6.  Laparoscopic cholecystectomy,  March 2004 for ascending cholangitis,      successful.  7.  Lower extremity neuropathy.  8.  Adult onset diabetes mellitus.  9.  Venous edema.  10. Chronic mild renal insufficiency.   ATRIAL MEASUREMENTS:  Flutter waves measure 6 to 8.1 mV.  Atrial impedance  400 ohms.   UNIPOLAR VENTRICULAR:  R equals 17.3 mV, impedance 736 ohms, threshold 0.8 V  at 0.5 msec.   BIPOLAR VENTRICULAR:  R equals 18.0 mV, impedance 824 ohms, threshold 1.1 V  at 0.5 msec.   Magnet rate at BOL equals 98.5, at [facility] magnet rate drops to 85.3.   DESCRIPTION OF PROCEDURE:  The patient was brought to the second floor  electrophysiology laboratory in the postabsorptive state after 5 mg Valium  p.o. premedication.  She was given 1 g of vancomycin IV as antibiotic  prophylaxis.  The right groin and left anterior chest was prepped and draped  in the usual manner.  1% Xylocaine was used for local anesthesia.  The left  chest site was completely draped and covered and we approached the right  groin. The right femoral vein  was entered under 1% Xylocaine anesthesia with  an anterior puncture using 18 thin wall needle and a 6 French short side arm  sheath was inserted using modified Seldinger technique.  Balloon tip  temporary pacer was then advanced under fluoroscopic control to the right  ventricle.  Sheath was sewn in place with a #1 silk suture to prevent  migration, threshold testing was performed showing excellent threshold and  adequate back up temporary pacing since the patient was known to be  predominantly pacemaker dependent on a ventricular channel.  This area was  then covered.  Regowning and gloving was performed.  The left anterior chest  site was exposed.  A left infraclavicular curvilinear incision above the  previously placed incision after fluoroscopic visualization of the generator  and electrode was performed.  It was brought down through the subcutaneous  tissue using blunt dissection and limited  electrocautery to control  hemostasis.  The fibrous capsule was identified and incised.  It was then  opened and the pacemaker was freed up and delivered from the pocket.  The  anterior portion of the fibrous capsule was excised with all redundant  tissue. The pocket was revised inferiorly to accommodate the new generator  and capped electrode.  The atrial and ventricular hex nuts were opened.  Electrodes removed without difficulty and they appeared to be in good  condition including the pink connectors and the exposed leads on visual  inspection.  Excellent chronic unipolar and bipolar ventricular thresholds  were seen.  There was no diaphragmatic stimulation at 10 V output through  the PSA.  The patient was in underlying atrial flutter with large flutter  waves with previous history of AV node ablation.  She was deemed not a  Coumadin candidate because of advanced age, OBS and past falls and had  declined antiarrhythmic therapy remotely.  The atrial electrode was capped  with a Silastic cap which was secured with two #1 silk suture ties.  The  pocket was irrigated with 500 mg of kanamycin solution.  The sponge count  was correct.  The generator was delivered into the pocket with the  electrodes looped behind and the capped electrode looped behind.  It was  loosely secured to the posterior fibrous capsule with a #1 silk suture to  prevent migration away from the previously placed leads  The subcutaneous  tissue closed with two separate running layers of Vicryl suture and the skin  was closed with 4-0 subcuticular Vicryl suture, Steri-Strips were applied.  Fluoroscopy showed good position of the electrodes, right atrium and right  ventricle.  The temporary transvenous pacemaker was removed under  fluoroscopic control.  The patient tolerated the procedure well.  She was  transferred to the holding area for postoperative programming, reprogramming in stable condition and right femoral vein  sheath removal with pressure  hemostasis.      [name], M.D.  Electronically Signed     RAW/MEDQ  D:  12/31/2004  T:  01/01/2005  Job:  [id number]   cc:   Barry Dienes. Eloise Harman, M.D.  Fax: [phone]   Second Floor CP Lab   Pacer Lab at Dr. Kandis Cocking office

## 2010-07-06 NOTE — Cardiovascular Report (Signed)
[place]  Patient:    [name], [name]                         MRN: [id number] Proc. Date: 05/31/99 Adm. Date:  [id number] Attending:  Ruta Hinds CC:         Cardiac Catheterization Laboratory             Richard A. Alanda Amass, M.D.             Barry Dienes Eloise Harman, M.D.                        Cardiac Catheterization  PROCEDURE PERFORMED: 1. Left heart catheterization. 2. Coronary angiography. 3. Left ventriculogram.  CARDIOLOGIST:  Lenise Herald, M.D.  COMPLICATIONS:  None.  INDICATIONS:  [name] is an 77 year old white female, a patient of Dr. Pearletha Furl. [name] and Dr. [name]. [name], with a history of hypertension, hypothyroidism, CAD, status post cardiac catheterization in February 2000, revealing a totally-occluded proximal RCA, with a 40% circumflex lesion.  The patient is known to have a normal ejection fraction at the last cardiac catheterization.  In addition, she is status post DDD pacemaker.  She was admitted last evening with a complaint of recurrent substernal chest pain, with relief with sublingual nitroglycerin.  She is now referred for a repeat cardiac catheterization.  DESCRIPTION OF PROCEDURE:  After giving an informed written consent, the patient was brought to the cardiac catheterization where the right and left groins were  shaved, prepped, and draped in the usual sterile fashion.  Electrocardiogram monitoring was established.  Using a modified Seldinger technique, a 6-French arterial sheath was inserted in the right femoral artery.  The 6-French diagnostic catheters were then used to perform a diagnostic angiography.  RESULTS: 1. Left main coronary artery:  This revealed a medium-sized left main with    no significant disease. 2. Left anterior descending coronary artery:  The left anterior descending is    a large vessel which coursed around the apex and gave rise to one diagonal    branch.   The LAD has no significant disease.  The first diagonal is a    medium-sized vessel which bifurcates in its midportion and has no significant    disease.  The distal LAD gives left to right collaterals to the distal RCA. 3. Circumflex coronary artery:  The circumflex is a medium-sized vessel which    coursed in the AV groove, and gave rise to two obtuse marginal branches.    The AV groove circumflex has a 40% mid-vessel stenotic lesion.  The first    OM is a small vessel with no significant disease.  The second OM is a large    vessel which bifurcates distally and has no significant disease.  The distal    OM gives left to right collaterals to the distal RCA. 4. Right coronary artery:  Is a small vessel which is totally occluded in its    proximal portion.  Again the distal RCA fills via right to right and left    to right collaterals.  LEFT VENTRICULOGRAM:  The left ventriculogram reveals a mildly-depressed ejection fraction of 45%-50%.  There is a significant amount of ectopy, and I am unable to assess the degree of mitral regurgitation.  HEMODYNAMICS: Systemic arterial pressure:  140/75. LV systemic pressure:  150/18. LVEDP  24.  CONCLUSIONS: 1. Significant one-vessel coronary artery disease. 2. Mildly  depressed left ventricular systolic function with apical and    inferoapical hypokinesis. 3. Significant amount of ventricular ectopy, making an assessment of mitral    regurgitation unreliable.  DISPOSITION:  Suggest a followup with a 2-dimensional echocardiogram. DD:  05/31/99 TD:  05/31/99 Job: [id number]

## 2010-07-06 NOTE — Discharge Summary (Signed)
NAMEFAITHANN, [name]                            ACCOUNT NO.:  [id number]   MEDICAL RECORD NO.:  [id number]                   PATIENT TYPE:  INP   LOCATION:  5031                                 FACILITY:  [facility]   PHYSICIAN:  Harvie Junior, M.D.                DATE OF BIRTH:  [birth year]   DATE OF ADMISSION:  06/15/2003  DATE OF DISCHARGE:  06/22/2003                                 DISCHARGE SUMMARY   ADDENDUM:  Ms. Doffing was kept in the hospital for a couple of additional  days until Jun 22, 2003, due to acute renal insufficiency, mild to moderate  CHF, and some abnormal LFTs.  She also had some hyponatremia, and it was due  to her medical physician, Dr. Eloise Harman, that she would be best served kept  in the hospital for management of these medical problems.  The patient on  Jun 22, 2003, was overall doing well with some mild to moderate sedation,  still some constipation.  She was sleepy but arousable.  Her vital signs  were stable, she is afebrile.  Her left hip wound was benign.  Her  laboratory data showed a hemoglobin of 10, hematocrit of 30, WBC of 8.3.  Her INR was 2.1.  Her potassium was 5.3.  Creatinine was 1.1 and sodium was  135.  Her chest was clear.  Her heart was regular rate and rhythm with a 2/6  systolic ejection murmur.  Her abdomen was soft and nontender.   The patient's activity status will be bed-to-chair transfers, weightbearing  as tolerated on the left, and walker ambulation and weightbearing as  tolerated on the left.  She will need the staples removed from her left hip  in 10 days.   Medications on discharge, this will be a whole new sheet of medications:   1. Coumadin 5 mg one daily, pharmacist to adjust dose as needed, x1 month     postop.  2. Vicodin 5 mg one p.o. q.i.d. p.r.n. moderate pain.  3. Tylenol 500 mg p.o. t.i.d.  4. Imdur 30 mg p.o. daily.  5. Allopurinol 100 mg p.o. daily.  6. Aldactone 50 mg p.o. daily.  7. Lasix 80 mg q.a.m., 40 mg q.p.m.  8. Neurontin 300 mg p.o. q.h.s.  9. Miralax 17 g p.o. b.i.d.  10.      Senna S two p.c. daily.  11.      Dulcolax 10 mg per rectum p.r.n. constipation.  12.      Os-Cal D 500 mg p.o. b.i.d.  13.      Vitamin B12 IM 100 mcg each month.  14.      Reglan 5 mg p.o. b.i.d.  15.      Restasis 0.05% one drop in both eyes b.i.d.  16.      Restoril 7.5 mg p.o. q.h.s.  17.      Digoxin 0.125 mg p.o.  daily.  18.      Toprol XL 12.5 mg p.o. daily.   The patient will follow up with Dr. Luiz Blare in his office in three weeks, and  Dr. Eloise Harman will follow the patient at [facility].   CONDITION ON DISCHARGE:  Improved.   DIET ON DISCHARGE:  Regular.      Marshia Ly, P.A.                       Harvie Junior, M.D.    Cordelia Pen  D:  06/22/2003  T:  06/22/2003  Job:  [id number]   cc:   Barry Dienes. Eloise Harman, M.D.  [address]  [place]  [place] [address]  Fax: [phone]

## 2010-07-06 NOTE — H&P (Signed)
NAMEBARBI, [name]                  ACCOUNT NO.:  [id number]   MEDICAL RECORD NO.:  [id number]          PATIENT TYPE:  INP   LOCATION:  4735                         FACILITY:  [facility]   PHYSICIAN:  Barry Dienes. Eloise Harman, M.D.DATE OF BIRTH:  December 15, 1910   DATE OF ADMISSION:  11/26/2005  DATE OF DISCHARGE:                                HISTORY & PHYSICAL   CHIEF COMPLAINT:  Acute swelling of the upper part of her body.   HISTORY OF PRESENT ILLNESS:  Patient is a 77 year old white female with  multiple medical problems.  Approximately three days ago, she was noted to  have some left-sided supraclavicular and neck fullness.  Approximately two  days prior to admission, she began to have swelling on the right side of her  neck in addition.  The swelling is worse when she is lying supine and is  associated with mild dyspnea and mild right pleuritic chest pain.  She has  not had any fever or chills.  She has no history of cancer.  She does have a  history of a cardiac pacemaker that was last changed approximately one year  ago.  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   MEDICATIONS PRIOR TO ADMISSION:  1. Lovenox 40 mg subcutaneous every 12 hours that was started on October      7.  2. Lyrica 150 mg p.o. q.a.m. and h.s. and 50 mg p.o. at noon.  3. Aspirin 81 mg daily.  4. Rocaltrol 0.25 mg once daily.  5. Allopurinol 100 mg daily.  6. Lasix 80 mg every a.m. and 40 mg every p.m.  7. Spironolactone 50 mg daily.  8. Cerefolin NAC one tablet p.o. daily.  9. Ambien CR 12.5 mg q.h.s.  10.Prilosec 20 mg daily.  11.Omega3 fish oil 1000 mg capsules, take three capsules  daily.  12.Vitamin B 12 1000 mcg IM every six weeks.  13.Lotemax 0.5% eye drops, one drop to right eye twice daily.  14.Restasis 0.05% eye emulsion, one drop into each eye twice daily.  15.Senna-S two tablets p.o. daily, hold for diarrhea.  16.TheraTears liquid gel, two drops in each eye q.i.d.  17.Tylenol 500 mg p.o. daily p.r.n. pain.  18.Claritin 10 mg p.o. daily p.r.n. congestion.  19.Dulcolax 10 mg suppository once daily p.r.n. constipation.  20.Lacrilube, apply small amount to each eye q.h.s. p.r.n. dry eyes.  21.Genteel lubricant to both eyes q.i.d. p.r.n.  22.MiraLax 17 gm daily p.r.n. constipation.  23.Robitussin 15 mL p.o. t.i.d. p.r.n. cough.  24.Nasal Atrovent 0.06% two sprays each nostril t.i.d. p.r.n. nasal      congestion.  25.Milk of magnesia 30 mL p.o. daily p.r.n. constipation.  26.Nitroglycerin 0.4 mg sublingual p.r.n. chest pain.   ALLERGIES AND MEDICATION INTOLERANCES:  PENICILLIN AND CODEINE CAUSE MARKED  CONFUSION.   PAST SURGICAL HISTORY:  1. Total abdominohysterectomy and appendectomy.  2. In 1990, right total knee replacement.  3. In 1997, cardiac pacemaker replacement.  4. November of 2006, cardiac pacemaker generator change with St. Jude's      Verity ADX SSIR pacer.  5. In 2001, cataract operations bilaterally.  6. In 2001, left total knee replacement.  7. March of 2004, open cholecystectomy.  8. April of 2005, fall with left hip fracture and open reduction and      internal fixation.   POINTS OF CONTACT:  Chrisandra Wiemers (son), telephone [phone] and Ilean China  (grand-daughter), telephone [phone] and [phone] (work).   PREVIOUS CODE STATUS:  Do not resuscitate.   SOCIAL HISTORY:  She has been a widow for a number of years.  She has been a  resident of the [facility] again for several years.  She has no  significant history of tobacco or alcohol use.  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    FAMILY HISTORY:  Noncontributory.   REVIEW OF SYSTEMS:  She has not had recent change in her weight or fever or  chills.  She has chronic mild-to-moderate dyspnea on exertion that has  slightly worsened over the past few days.  She has had orthopnea over the  past few days.  She denies substernal chest pain, however she has had mild  right-sided pleuritic chest pain over the past few days.  She denies recent  nausea, vomiting, constipation, diarrhea, or dysuria.  She has chronic numb  lower extremities from the knees down with some tingling discomfort.  She  has moderate ataxia and needs assistance with walking.  Several days ago,  she was assisted to the floor while trying to go to the bathroom without  apparent injury.  She denies anxiety or depression.  She has chronic  insomnia and oftentimes has daytime naps.   INITIAL PHYSICAL EXAM:  VITAL SIGNS:  Blood pressure 110/67, pulse 69,  respirations 20, temp 97.6.  GENERAL:  She is an elderly white female with  facial plethora while sitting in a wheelchair.  HEENT EXAM:  Was otherwise unremarkable.  NECK:  Had normal range of motion with bilateral diffuse fullness and no  cervical lymphadenopathy.  Jugular venous distension was hard to discern and  there was no evidence of a carotid bruit.  CHEST:  Had bibasilar crackles.  HEART:  Had a regular rate and rhythm without significant murmur or gallop.  ABDOMEN:  Had normal bowel sounds and no hepatosplenomegaly or tenderness.  EXTREMITIES:  Had bilateral 1+ feet edema that was slightly greater on the  left than the right with no evidence of a palpable venous cord.  NEUROLOGICAL EXAM:  She was alert and well oriented and continues to have  bilateral numbness of the legs and feet.   LABORATORY DATA:  November 24, 2005 Labs:  White blood cell count 11.6 with  72% neutrophils, hemoglobin 13, hematocrit 36, platelets 246, serum sodium 134, potassium 4.3, chloride 97, carbon dioxide 28, BUN 30,  creatinine 1.5,  glucose 151, total protein 6.8, albumin 2.9.   October 24, 2005 chest x-ray showed the following:  1. Cardiomegaly with calcification and ectasia of the aorta along with      central vascular engorgement, but no peripheral signs of pulmonary      edema, focal infiltrate or atelectasis.  2. Transvenous pacemaker with satisfactory position of dual electrodes.   A CT scan of the neck and chest is to be done today and results  are pending  at the time of dictation.   IMPRESSION/ PLAN:  1. Upper body edema:  This is most likely due to acute obstruction of the      superior vena cava.  Alternatively, this could be due to lymphatic      obstruction of the upper body.  Given her low BNP level, congestive      heart failure is quite unlikely.  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  We will review images of the CT      scan of the neck and chest today.  Unfortunately, due to her slightly      elevated serum creatinine level contrast will not likely be given.  We      will continue empiric IV heparin for now.  I have asked her      cardiologist to assess her.  2. Pleuritic chest pain:  This is likely secondary to a pulmonary embolism      versus a nondisplaced rib fracture from her recent minor fall.  We will      check an AP chest x-ray and consider rib x-rays and VQ scan if no      thrombus is evident on her CT scan.  3. Congestive heart failure:  Currently stable with a low BNP level on her      medications.  4. Diabetes mellitus, type 2:  Well controlled on her current medical      regimen.  We will consider having her on sliding-scale insulin during      her hospitalization.           ______________________________  Barry Dienes Eloise Harman, M.D.     DGP/MEDQ  D:  11/26/2005  T:  11/26/2005  Job:  [id number]   cc:   Gerlene Burdock A. Alanda Amass, M.D.  Catherine A. Orlin Hilding,  M.D.  Camille Bal, M.D.

## 2010-07-06 NOTE — Discharge Summary (Signed)
[name], [name]                  ACCOUNT NO.:  [id number]   MEDICAL RECORD NO.:  [id number]          PATIENT TYPE:  OIB   LOCATION:  6533                         FACILITY:  [facility]   PHYSICIAN:  Richard A. Alanda Amass, M.D.DATE OF BIRTH:  1910/08/15   DATE OF ADMISSION:  12/31/2004  DATE OF DISCHARGE:  01/01/2005                                 DISCHARGE SUMMARY   ADMISSION DIAGNOSES:  1.  End-of-life permanent transvenous pacemaker generator change.  2.  Coronary artery disease.  3.  History of AV nodal ablation for atrial fibrillation with rapid      ventricular rate, now chronic AF.  4.  Not on Coumadin, patient request.  5.  Questionable history of cerebrovascular accident.  6.  Lower extremity neuropathy.  7.  Gastroesophageal reflux disease.  8.  Hypothyroid.  9.  History of anemia.  10. Gout.  11. Adult-onset diabetes mellitus.   DISCHARGE DIAGNOSES:  1.  End-of-life permanent transvenous pacemaker generator change.  2.  Coronary artery disease.  3.  History of AV nodal ablation for atrial fibrillation with rapid      ventricular rate, now chronic atrial fibrillation.  4.  Not on Coumadin, patient request.  5.  Questionable history of cerebrovascular accident.  6.  Lower extremity neuropathy.  7.  Gastroesophageal reflux disease.  8.  Hypothyroid.  9.  History of anemia.  10. Gout.  11. Adult-onset diabetes mellitus.   PROCEDURES:  Permanent transvenous pacemaker generator change with a [employer].  Jude[employer] VARITY ADX SSIR model #5516 permanent transvenous pacemaker serial  [id number].  Date of implantation is December 31, 2004.   BRIEF HISTORY:  The patient is a 77 year old female who has a history of  coronary artery disease including total RCA with right-to-right and left-to-  right collaterals.  She also has a 40% circumflex, no LAD or diagonal  disease.  Her EF is 45-50%.  She has been treated medically.  She has  occasional chest discomfort.  She also has angina.  It is  difficult to tell  if her chest discomfort is anginal or GERD.  There has been no nitroglycerin  use but she remains on a nitroglycerin patch.  Clinically there has been no  symptoms to suggest unstable angina.  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  She is here today with her son and daughter.  Interrogation of her Trilogy DR pulse generator placed May 25, 1996 by Dr.  Chales Abrahams in [facility] shows generator is at end-of-life.  The patient had an  AV nodal ablation for atrial fibrillation with rapid ventricular response.  She apparently had sinus rhythm restored initially but has subsequently been  found to be in chronic atrial fibrillation/flutter.  She was seen in the  office by Dr. Alanda Amass on December 26, 2004 and at this point it was his  recommendation she undergo permanent transvenous pacemaker generator change.  Patient has declined anti-arrhythmic therapy for PAF in the past.  She has  also declined Coumadin therapy.  She has had an ulcer and felt not to be  a  candidate because of her advanced age and previous falls and unsteady gait,  although she does use her walker.  There is also a question of possible CVA  in the past with no residual.   MEDICATIONS ON ADMISSION:  1.  Lasix 80 mg daily.  2.  Allopurinol 100 mg daily.  3.  Calcium 0.25 mcg daily.  4.  Aldactone 25 mg daily.  5.  Zelnorm 6 mg daily.  6.  Mylanta p.r.n.  7.  Darvocet p.r.n.  8.  Tylenol p.r.n.  9.  Neurontin 100 mg a.m., 600 mg in the p.m.  10. Nitroglycerin patch 0.1 mg.  11. Ambien 10 mg p.r.n.  12. Lorazepam 0.5 mg p.r.n.  13. Dulcolax p.r.n.  14. MiraLax in water or juice daily.  15. Aspirin 81 mg daily.  16. Nitroglycerin p.r.n.  17. Lyrica 100 mg daily.  18. Fluoromethylene eye drops one right eye daily.  19. Folic acid 1 mg daily.   For further history and physical please see the note.   HOSPITAL COURSE:  Patient was admitted.  Underwent permanent transvenous   pacemaker change using the [employer]. Jude[employer] VARITY ADX SSIR pacer.  Patient  tolerated the procedure well.  The following morning, electrolytes were  normal.  Her potassium was elevated on admission.  It was Dr. Kandis Cocking  opinion that the patient should go home off aldactone.  He also wanted to  place her on Cipro for three days and Protonix 40 mg one daily.  Will have  her come back in a week for a wound check and then we will schedule her to  see Dr. Alanda Amass in three to four weeks for follow-up.   DISCHARGE ACTIVITIES:  Light to moderate.  No lifting over 10 pounds.  No  driving.  No strenuous activity.  She has had no lead changes and her  activity is limited so we will put no new restrictions on her activity.   DISCHARGE MEDICATIONS:  1.  Cipro 500 mg b.i.d.  2.  Protonix 40 mg one daily.  3.  Mylanta p.r.n.  4.  Nitroglycerin 0.4 sublingual p.r.n.  5.  Robitussin p.r.n.  6.  Dulcolax p.r.n.  7.  Tylenol 500 mg p.r.n.  8.  Lorazepam 0.5 mg b.i.d. p.r.n.  9.  Ambien h.s. p.r.n.  10. Aspirin 81 mg daily.  11. Calcitriol 0.25 mcg daily.  12. Allopurinol 100 mg a.m.  13. Furosemide 80 mg daily.  14. Spironolactone is currently discontinued.  15. Zelnorm 6 mg daily.  16. Nitroglycerin patch 0.1 mg daily.  17. B12 shots 1000 mcg monthly.  18. Neurontin 300 mg a.m. and 600 mg in the p.m.  19. Liquid Tears two drops both eyes daily.  20. Calcium and vitamin D 500 mg b.i.d.  21. Ophthalmic drops p.r.n.   LABORATORIES:  Postoperative BMET shows sodium 140, potassium 4, chloride of  102, CO2 of 27, BUN of 25, creatinine 1.4, and a glucose of 126.   CONDITION ON DISCHARGE:  Stable.      Eber Hong, P.A.      Richard A. Alanda Amass, M.D.  Electronically Signed    WDJ/MEDQ  D:  01/01/2005  T:  01/01/2005  Job:  [id number]   cc:   [facility]   Barry Dienes. Eloise Harman, M.D.  Fax: [phone]

## 2010-07-06 NOTE — Discharge Summary (Signed)
[facility]  Patient:    [name]                          MRN: [id number] Adm. Date:  [id number] Disc. Date: [id number] Attending:  Ruta Hinds Dictator:   Halford Decamp Delanna Ahmadi, R.N., N.P.                           Discharge Summary  HISTORY OF PRESENT ILLNESS AND HOSPITAL COURSE:  [name] is an 77 year old female patient with a prior medical history of congestive heart failure and PTVDP. She had apparently a two- to three-day history of cold symptoms with increasing  dyspnea.  She was seen by Dr. Aleen Campi and admitted for observation overnight. Her chest x-ray showed some vascular congestion, no infiltrate.  She was given IV Lasix 80 mg.  She diuresed about 2000 cc of urine while she was in the ER.  On the morning of February 12, 1999, she had no complaints, no shortness of breath. Her blood pressure was 100/40.  She was seen by Dr. Susa Griffins.  It was decided that she could be discharged home.  Because of her low blood pressure, her Lasix was held the morning of her discharge.  LABORATORY DATA:  Hemoglobin 11.7, WBC 7.6, platelets 238.  Sodium 139, potassium 3.6, glucose 110, creatinine 1.1, BUN ______ .  CK-MBs were negative x 2. Troponin negative x 1.  EKG showed AV pacing.  I do not see her chest x-ray in the chart at this time.  I do not see her discharge summary sheet in the chart at this time.  MEDICATIONS:  On the morning of her discharge, her medications were: 1. Zocor 20 mg every ______ . 2. Aspirin 81 mg every day. 3. Altace 2.5 mg b.i.d. 4. Celebrex 100 mg b.i.d. 5. Potassium 10 mEq b.i.d. 6. Lasix was held.  DISCHARGE DIAGNOSES: 1. Congestive heart failure. 2. History of permanent transvenous pacemaker, placed by Dr. Aleen Campi in [facility]. 3. History of hypotension. 4. History of atrial fibrillation. 5. History of colon cancer. 6. 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 7. Echocardiogram in 1997 showed mild left ventricular hypertrophy and an ejection    fraction of 60%. 8. History of hypothyroidism. DD:  04/04/99 TD:  04/05/99 Job: [id number]

## 2010-07-06 NOTE — Op Note (Signed)
[place]  Patient:    [name], [name]                           MRN: [id number] Proc. Date: 11/28/99 Adm. Date:  12/03/99 Attending:  Harvie Junior, M.D.                           Operative Report  PREOPERATIVE DIAGNOSIS:  Degenerative joint disease of the left knee.  POSTOPERATIVE DIAGNOSIS:  Degenerative joint disease of the left knee.  OPERATION PERFORMED:  Left total knee arthroplasty with Depuy LCS cemented components.  A standard plus femoral component with a standard plus patella. A large tibial component with a 12.5 mm bridging bearing.  SURGEON:  Harvie Junior, M.D.  ASSISTANT:   Currie Paris. Thedore Mins.  ANESTHESIA:  General.  INDICATIONS FOR PROCEDURE:  The patient is an 77 year old female with a long history of severe degenerative joint disease of the left knee.  We put her through a series of cortisone injections and a series of Hyalgan injections, activity modification, anti-inflammatory modifications.  She failed all of these modalities and because of failure with end-stage degenerative joint disease, she is brought to the operating room for total knee replacement.  The patient was brought to the operating room after a thorough preop evaluation revealed that she was an adequate operative candidate for total knee replacement.  DESCRIPTION OF PROCEDURE:  The patient was brought to the operating room where she was placed supine on the operating table.  General anesthetic was administered.  The left hip and left leg was prepped and draped in the usual sterile fashion.  Following this, a midline incision was made and the subcutaneous tissues were dissected down the extensor mechanism, a medial parapatellar arthrotomy was undertaken and flaps were raised.  The medial and lateral meniscectomy were performed as well as resection of the anterior and posterior cruciates.  The medial flap was raised around to the back of the knee to  allow access to the knee.  At this point the proximal tibial cut was made after an intramedullary jig was used and then following this, an intramedullary pilot hole was made into the femur and the jig was put in place after sizing of the femoral component showed the standard plus to be the most appropriate size.  At this point the jig was put in place.  anterior and posterior cuts were made and the notch was identified.  Attention was then turned to the chamfer cuts and the box cut which were then next made and a trial femoral component was put in place.  Attention was turned to the femoral side where the instrumentation was used for a large rotating platform tibia. The large was put in place and the corresponding jigs were used to match the cancellous surface to the component.  At this point the central portion was identified.  A 12.5 mm bridging bearing was used throughout the case. The case was done initially by making a posterior femoral cut and checking the posterior alignment noted to be 12.5.  The distal femoral cut was then made to match the 12.5 mm gap coming out into full extension.  Once the gaps were equalized, the remaining cuts were made as described above.  At this point attention was turned to the patella where it was cut down a level of 13 mm thickness and then a three-peg  standard plus patella was drilled in place.  At this point excellent midline tracking was noted in the patella and excellent easily full extension and full flexion was achieved.  At this point all components were removed and the knee was copiously irrigated with pulsatile lavage irrigation and suctioned dry.  The components were then cemented into place, standard plus femur, standard plus patella and large tibial component, 12.5 mm deep dish rotating platform, polyethylene liner was put in place. Excellent range of motion was achieved and stability.  At this point the knee was again copiously irrigated and  suctioned dry.  After the cement had all dried and all excess cement was removed and the medial parapatellar arthrotomy was closed with a #1 Vicryl running suture, the subcutaneous was then closed with 0 and 2-0 Vicryl and the skin with skin staples.  Sterile compressive dressing was applied and the patient was taken to the recovery room where she was noted to be in satisfactory condition.  Two medium Hemovac drains were placed prior to closure. DD:  08/06/00 TD:  08/06/00 Job: [id number]

## 2010-07-06 NOTE — Op Note (Signed)
NAMEOMNI, DUNSWORTH                            ACCOUNT NO.:  [id number]   MEDICAL RECORD NO.:  [id number]                   PATIENT TYPE:  INP   LOCATION:  5031                                 FACILITY:  [facility]   PHYSICIAN:  Harvie Junior, M.D.                DATE OF BIRTH:  [birth year]   DATE OF PROCEDURE:  06/15/2003  DATE OF DISCHARGE:                                 OPERATIVE REPORT   PREOPERATIVE DIAGNOSIS:  Intertrochanteric hip fracture, left.   POSTOPERATIVE DIAGNOSIS:  Intertrochanteric hip fracture, left.   OPERATION PERFORMED:  Intramedullary rodding of left intertrochanteric hip  fracture with a Synthes Gamma type nail system.   SURGEON:  Harvie Junior, M.D.   ASSISTANT:  Marshia Ly, P.A.   ANESTHESIA:  General.   INDICATIONS FOR PROCEDURE:  The patient is a 77 year old female with a long  history of having fallen early this morning.  She suffered left hip pain.  X-  ray showed she had an intertrochanteric fracture and because of continued  complaints of pain, she was ultimately evaluated and noted to have this  fracture.  We talked about treatment options but felt that closed reduction  and intramedullary fixation would be the most appropriate course of action  and she was brought to the operating room for this procedure.   DESCRIPTION OF PROCEDURE:  The patient was brought to the operating room and  after adequate anesthesia was obtained with general anesthetic, the patient  was placed supine on the operating table.  She then had manipulative closed  reduction of her left intertrochanteric hip fracture and near anatomic  reduction was achieved at this point. Fluoroscopic imaging was then used to  assure this anatomic reduction.  At this point the left hip was prepped and  draped in the usual sterile fashion.  After this, a small stab incision was  made proximally and a guidewire was placed out the central portion of the  canal through the greater trochanter.   17 mm reamer was then used to open  the canal and then a 12 mm nail was advanced down the central portion of the  femur.  Following this, the guide was advanced to the appropriate location  and a guidewire was used in the central portion of the head on both AP and  lateral.  130 degree angled guide was used and the guidewire was advanced to  the central portion of the head on AP and lateral.  At this point the  ____________ Synthes intramedullary nail system was used and a 95 mm blade  was used.  It was advanced to the lateral cortex.  This was then locked in  place through the top.  This was with a screw that was locked to allow for  compression without rotation.  At this point the distal interlocking was  then used and under fluoroscopic imaging the distal interlocking  screw was  put in place.  At this point the final imaging was taken.  Excellent  reduction and compression was achieved.  The wound at this point was  copiously irrigated and suctioned dry.  And the incisions were closed with a  combination of 2-0 and staples.  Sterile compressive dressing was applied.  The patient was taken to the recovery room where she's noted to be in  satisfactory condition.  The estimated blood loss for this procedure was 100  mL.                                              Harvie Junior, M.D.   Ranae Plumber  D:  06/15/2003  T:  06/16/2003  Job:  [id number]

## 2010-07-06 NOTE — Discharge Summary (Signed)
[name], [name]                            ACCOUNT NO.:  [id number]   MEDICAL RECORD NO.:  [id number]                   PATIENT TYPE:  INP   LOCATION:  6213                                 FACILITY:  [facility]   PHYSICIAN:  Harvie Junior, M.D.                DATE OF BIRTH:  1910/06/04   DATE OF ADMISSION:  06/15/2003  DATE OF DISCHARGE:  06/18/2003                                 DISCHARGE SUMMARY   ADMISSION DIAGNOSES:  1. Intratrochanteric fracture left hip.  2. Severe peripheral neuropathy, idiopathic with gait instability.  3. History of gouty arthritis.  4. Remote history of congestive heart failure.  5. Chronic atrial fibrillation, pacemaker dependent.  6. Hyperlipidemia.  7. Pernicious anemia.  8. Hypothyroidism.  9. Type 2 diabetes.   DISCHARGE DIAGNOSES:  1. Intratrochanteric fracture left hip.  2. Severe peripheral neuropathy, idiopathic with gait instability.  3. History of gouty arthritis.  4. Remote history of congestive heart failure.  5. Chronic atrial fibrillation, pacemaker dependent.  6. Hyperlipidemia.  7. Pernicious anemia.  8. Hypothyroidism.  9. Type 2 diabetes.  10.      Urinary tract infection.   PROCEDURES PERFORMED:  Open reduction internal fixation left hip.   CONSULTATIONS:  Internal medicine, Ivery Quale, M.D.   BRIEF HISTORY:  [name] is a pleasant 77 year old white female who has  multiple medical problems all of which are stable. She on the day of  admission was living at [facility] and was going to the  bathroom. She slipped on the floor and fell onto her left hip. She had  moderately severe pain of her hip and was brought to [facility]  Emergency Room where x-rays of the pelvis and left hip showed a left hip  intratrochanteric fracture. She was admitted to the hospital for this. We  will have her worked up preoperatively for medical management by  Dr.  [name].   PERTINENT LABORATORY STUDIES:  Hemoglobin  on admission was 14.8, hematocrit  44.2. On postoperative day number one hemoglobin was 14.1. On postoperative  day number two hemoglobin was 1.9. ProTime on admission was 14.0 seconds and  an INR of 1.1. On postoperative day number two on Coumadin therapy her INR  was 1.2 with a ProTime of 14.3. Her CMET on admission showed a decreased  sodium of 133, BUN 55, creatinine 1.8, calcium 11.4,  slightly elevated AST  and ALT at 54 and 62, respective. A B-type natriuretic peptide was 213.7.  Urinalysis on admission showed many bacteria with [place] casts. X-ray of  the chest showed cardiomegaly with no evidence for active chest disease. X-  ray of the left hip showed a non-displaced transtrochanteric fracture of the  left hip. EKG on admission showed electronic pacemaker with atrial  fibrillation.   HOSPITAL COURSE:  The patient was admitted through the emergency room with a  left hip fracture.  She was seen by Dr. Ivery Quale for preoperative  medical clearance. Once this was obtained she was brought to the operating  room where she underwent surgery on her left hip as is well described by Dr.  Darene Lamer operative note. Postoperatively she was put on IV fluids and Ancef 1  g IV q.8h. x5 doses. This was felt to be satisfactory to sterilize her  bladder where she had many bacteria on her preoperative urinalysis. Ancef  was also given preoperatively. She was also started on Coumadin and  thrombolytic therapy per pharmacy protocol x1 month postoperative. She was  on a regular diet. On postoperative day #1 the patient was overall doing  well. Her coronary artery disease was stable. Her diabetes was overall  stable and she was getting good pain control. She really had minimal hip  pain. She was gotten out of bed with physical therapy. Weightbearing as  tolerated on the left hand side. On postoperative day number two the patient  was without complaints. She did have a decreased appetite. Her vital  signs  were stable. Her vital signs were 91/38. Her pO2 was 96% on  2 L. Her  potassium was 5.2, sodium 128, BUN 52, creatinine 2.0. Her INR was 1.2 and  her Coumadin level was 11.9. The patient was alert and oriented. Her left  hip dressing was clean and dry and Ambu was intact to her left lower  extremity. She was able to get out of the bed to the chair with physical  therapy.  Weightbearing was as tolerated on the left side. It was felt that  she was stable to be transferred back to [facility] on  postoperative day number three which will be June 18, 2003.   CONDITION ON DISCHARGE:  Improved.   DIET ON DISCHARGE:  Regular.   ACTIVITY:  Weightbearing as tolerated on the left with a walker. She can do  bed to chair transfers as well.   DISCHARGE MEDICATIONS:  1. Neurontin 300 mg q.a.m., 600 mg q.p.m.  2. Lasix 40 mg q.a.m. and 40 mg q.p.m.  3. Lanoxin 0.125 mg one-half tablet daily.  4. Allopurinol 100 mg daily.  5. Toprol XL 25 mg daily.  6. Isosorbide 30 mg daily.  7. Colchicine 0.6 mg b.i.d.  8. Spironolactone 50 mg daily.  9. Restoril 7.5 mg q.h.s.  10.      __________  0.05% eye drops bilaterally b.i.d.  11.      Colace 100 mg b.i.d.  12.      Percocet 5 mg one q.6h. p.r.n. pain.  13.      Coumadin per pharmacy protocol for DVT prophylaxis x3 weeks     postoperative shooting for an INR between 1.5 and 2.0.   DISCHARGE INSTRUCTIONS:  The patient will need to physical therapy daily for  bed to chair transfers. Weightbearing as tolerated on the left. She can  walker ambulate as tolerated as well. She will need the staples removed from  her left hip when she is two weeks postoperative. She will need to follow up  with Dr. Luiz Blare in his office when she is three weeks postoperative. She  will need x-rays of the left hip at that time. Dr. [name] will see her at  [facility] p.r[facility]n. and I am sure he will be seeing her on his regular schedule.       Marshia Ly, P.A.  Harvie Junior, M.D.    Cordelia Pen  D:  06/17/2003  T:  06/17/2003  Job:  [id number]   cc:   Barry Dienes. Eloise Harman, M.D.  [address]  [place]  [place] [address]  Fax: [phone]   Anselm Pancoast. Zachery Dakins, M.D.  [address], [address]  [place]  [place] [address]  Fax: [phone]   Richard A. Alanda Amass, M.D.  [phone] [address]  [place]  [place] [address]  Fax: [phone]   Gustavus Messing. Orlin Hilding, M.D.  [address]  [place]  [place] [address]  Fax: [phone]   Camille Bal, M.D.  [address]  [place]  [place] [address]  Fax: [phone]

## 2010-07-06 NOTE — Op Note (Signed)
[name], [name]                            ACCOUNT NO.:  [id number]   MEDICAL RECORD NO.:  [id number]                   PATIENT TYPE:  INP   LOCATION:  0445                                 FACILITY:  [facility]   PHYSICIAN:  Anselm Pancoast. Zachery Dakins, M.D.          DATE OF BIRTH:  1910/06/30   DATE OF PROCEDURE:  04/28/2002  DATE OF DISCHARGE:                                 OPERATIVE REPORT   PREOPERATIVE DIAGNOSIS:  Chronic cholecystitis with dilated common bile  duct.   POSTOPERATIVE DIAGNOSIS:  Subacute cholecystitis with common duct stones.   PROCEDURE:  1. Attempted laparoscopic cholecystectomy, conversion to an open     cholecystectomy.  2. Cholangiogram, common duct exploration, and choledochoscopy.   ANESTHESIA:  General anesthesia.   SURGEON:  Anselm Pancoast. Zachery Dakins, M.D.   ASSISTANT:  Gita Kudo, M.D.   HISTORY:  The patient is an 77 year old female who was referred to the ER  yesterday by Barry Dienes. [name], M.D., for evaluation of abdominal pain.  The patient for about three to four weeks has had a lot of significant  nausea and vomiting that started off with what was a sore throat, and she  was on amoxicillin for a few days and then got a fungal infection and was  switched to other antibiotics, continued to have nausea and vomiting, and  there was some question of whether she possibly was icteric, and liver  functions were started at the [facility] where she resides, and  these were unremarkable.  Then she saw Dr. Eloise Jacobs, who referred her for an  ultrasound that was read yesterday by Audie Pinto, M.D., with a distended  gallbladder and a distended common bile duct.  She arrived at the emergency  room and on examination was afebrile and not significantly tender in the  abdomen.  Her lab studies, however, her BUN was 75 with a creatinine of 2.6,  and her calcium was mildly elevated at 11.4.  I hydrated her, white count  was not elevated, and then tried to  obtain a more recent BUN and creatinine  to see if she was chronically elevated, but the most recent I could identify  by Dr. Eloise Jacobs was about in 2001, at which time her BUN and creatinine were  in the normal, the BUN was less than 20.  She is on Lasix 80 mg a day and I  went ahead and hydrated her and she stared diuresing, and then the  creatinine last evening was 1.9 and this morning the BUN was 49, and she is  making urine.  She is not acutely tender but with the radiology report  showing a very dilated gallbladder with stones, I recommended that we  proceed on with a laparoscopic cholangiogram and talked with her and her  family members about a cholangiogram and that there may be a stone in the  common bile duct.  The patient  years ago had had a partial hysterectomy  through a very generous lower abdominal incision, and that is the only  abdominal surgery in addition to an appendectomy.   DESCRIPTION OF PROCEDURE:  I took her, and after induction of general  anesthesia she had 400 mg of Cipro IV, and the patient has a Foley catheter.  The abdomen was prepped with Betadine solution and draped in a sterile  manner.  A small incision was made below the umbilicus, carefully entering  into the peritoneal cavity.  She has extensive adhesions and with kind of  careful finger dissection, kind of continued to dissect toward the right and  upper abdomen.  The midline incision had kind of curved to the left of the  umbilicus, and I was getting in kind of soft adhesions but not any true free  peritoneum.  I had placed the Hasson cannula and was using the scope, trying  to blank down adhesions to see if we could get into a free peritoneal area.  I was dissecting on up, could actually roughly the gallbladder, which was  very dilated, and trying to get a window so I could place the upper 10 mm  port, and then we noticed that we were seeing a little bile leakage and at  this point I decided to  convert her to an open cholecystectomy.  The camera  was taken off the field and a right subcostal incision was made, sharp  dissection down through the skin, subcutaneous, anterior rectus fascia, with  the rectus muscle divided with cautery, and then carefully opening into the  peritoneum.  The intestine was just basically encased in massive adhesions  in all areas and when we got it open, it was not any easier.  I noted that  the bile was not coming from the gallbladder but a little loop of small  intestine that was up right at the gallbladder area was where the actual  bile, and this later was freed up.  I did culture the bile and then closed  it with 3-0 Vicryl and then later Lembert sutures of 3-0 silk.  The  adhesions were taken down enough so that we could actually expose the  gallbladder, which was very tense and kind of subacutely inflamed, and the  proximal portion of the gallbladder was dissected down and identifying the  cystic duct and cystic artery.  I then placed a right angle around the  cystic duct, tied it flush with the junction of the gallbladder, and then  made a small opening and tried to thread a Cook catheter within this, but I  was getting extravasation and was actually going through the back wall, and  then we re-switched the catheter and placed it in a little more proximally  and then could get flow into the common bile duct, which is massively  dilated.  The x-rays, we could not really definitely see that we had  definite stones, but there was definitely a nearly-obstructed distal common  bile duct, and I had gotten this horrible pussy junk out of the cystic duct  when I first opened the area that had been sent for culture.  At this point  I had Otis N. Fisher, M.D., look at the cholangiogram and he said he could  not definitely see a stone, but it was so massively dilated that I elected to go ahead and do a common duct exploration.  With taking the catheter  out,  there were little black  stones that came on through, and we washed these  out.  I then placed a traction suture of 4-0 Vicryl in the more proximal  common bile duct and then made a small opening and the choledochoscope was  introduced.  I could see toward the ampulla, could never actually get a  biliary Fogarty to go across the ampulla, but I could not see any stones  distally and I did not want to try to force a Fogarty or a dilator since her  tissues were so weak.  I then put a 16 ______ Rachelle Hora T-tube in.  This was  anchored in place with two sutures of 4-0 Vicryl, and then this was brought  out lateral to the incision through a stab wound.  A second Blake drain was  placed under this.  This ended at the bed of the gallbladder after we  removed the gallbladder.  The gallbladder bed hemostasis was obtained with  cautery, and we did place a little Surgicel in the bed.  Next we thoroughly  irrigated and reinspected the small bowel, and I could not see any other  rents in the small bowel, but I did think with the degree of adhesions,  etc., she will need a nasogastric tube postoperatively.  The posterior  fascia was closed in running #1 PDS, as was the anterior fascia.  In the  umbilical defect, I tied the pursestring and then put a second figure-of-  eight of 0 Vicryl, and then the skins were closed with staples.  The patient  tolerated the procedure nicely, had an adequate blood pressure and urine  output, and hopefully will be able to go back to the room if she is not too  confused in the recovery room.                                                Anselm Pancoast. Zachery Dakins, M.D.    WJW/MEDQ  D:  04/28/2002  T:  04/28/2002  Job:  [id number]   cc:   Barry Dienes. [name], M.D.  [address]  [place]  [place] [address]  Fax: [phone]

## 2010-12-03 LAB — BASIC METABOLIC PANEL
BUN: 28 — ABNORMAL HIGH
Calcium: 9.6
Chloride: 95 — ABNORMAL LOW
Creatinine, Ser: 1.52 — ABNORMAL HIGH
GFR calc non Af Amer: 31 — ABNORMAL LOW
Glucose, Bld: 196 — ABNORMAL HIGH
Potassium: 4.8
Potassium: 5
Sodium: 134 — ABNORMAL LOW

## 2010-12-03 LAB — PROTIME-INR
INR: 1.1
Prothrombin Time: 13
Prothrombin Time: 16.1 — ABNORMAL HIGH

## 2010-12-03 LAB — CBC
HCT: 29.1 — ABNORMAL LOW
HCT: 33.2 — ABNORMAL LOW
Hemoglobin: 11.3 — ABNORMAL LOW
Hemoglobin: 13.7
MCV: 99.9
Platelets: 210
Platelets: 262
RDW: 16.2 — ABNORMAL HIGH
RDW: 16.4 — ABNORMAL HIGH
WBC: 12 — ABNORMAL HIGH
WBC: 16.6 — ABNORMAL HIGH

## 2010-12-03 LAB — CROSSMATCH: Antibody Screen: NEGATIVE

## 2010-12-03 LAB — DIFFERENTIAL
Basophils Absolute: 0
Lymphocytes Relative: 10 — ABNORMAL LOW
Monocytes Absolute: 0.6
Neutro Abs: 11.2 — ABNORMAL HIGH

## 2010-12-03 LAB — I-STAT 8, (EC8 V) (CONVERTED LAB)
Acid-Base Excess: 7 — ABNORMAL HIGH
Bicarbonate: 32.2 — ABNORMAL HIGH
Potassium: 4.3
TCO2: 34
pCO2, Ven: 47.4
pH, Ven: 7.439 — ABNORMAL HIGH

## 2010-12-03 LAB — ABO/RH: ABO/RH(D): O NEG

## 2010-12-03 LAB — B-NATRIURETIC PEPTIDE (CONVERTED LAB): Pro B Natriuretic peptide (BNP): 188 — ABNORMAL HIGH

## 2010-12-03 LAB — POCT I-STAT CREATININE
Creatinine, Ser: 1.3 — ABNORMAL HIGH
Operator id: 285491

## 2010-12-18 ENCOUNTER — Other Ambulatory Visit (HOSPITAL_COMMUNITY): Payer: Self-pay | Admitting: Internal Medicine

## 2010-12-18 DIAGNOSIS — B999 Unspecified infectious disease: Secondary | ICD-10-CM

## 2010-12-19 ENCOUNTER — Other Ambulatory Visit (HOSPITAL_COMMUNITY): Payer: Self-pay | Admitting: Internal Medicine

## 2010-12-19 ENCOUNTER — Ambulatory Visit (HOSPITAL_COMMUNITY)
Admission: RE | Admit: 2010-12-19 | Discharge: 2010-12-19 | Disposition: A | Discharge status: Home / Self Care | Payer: PRIVATE HEALTH INSURANCE | Source: Ambulatory Visit | Attending: Internal Medicine | Admitting: Internal Medicine

## 2010-12-19 DIAGNOSIS — B999 Unspecified infectious disease: Secondary | ICD-10-CM

## 2010-12-19 DIAGNOSIS — J189 Pneumonia, unspecified organism: Secondary | ICD-10-CM | POA: Insufficient documentation

## 2011-03-14 ENCOUNTER — Other Ambulatory Visit (HOSPITAL_COMMUNITY): Payer: Self-pay | Admitting: Internal Medicine

## 2011-03-14 DIAGNOSIS — Z792 Long term (current) use of antibiotics: Secondary | ICD-10-CM

## 2011-03-15 ENCOUNTER — Ambulatory Visit (HOSPITAL_COMMUNITY)
Admission: RE | Admit: 2011-03-15 | Discharge: 2011-03-15 | Disposition: A | Discharge status: Home / Self Care | Payer: PRIVATE HEALTH INSURANCE | Source: Ambulatory Visit | Attending: Internal Medicine | Admitting: Internal Medicine

## 2011-03-15 ENCOUNTER — Other Ambulatory Visit (HOSPITAL_COMMUNITY): Payer: Self-pay | Admitting: Internal Medicine

## 2011-03-15 DIAGNOSIS — Z792 Long term (current) use of antibiotics: Secondary | ICD-10-CM | POA: Insufficient documentation

## 2011-03-15 NOTE — Procedures (Signed)
Long term antibiotic use  Rt SL PICC 37cm Tip in SVC/RA Ready for use  No complication

## 2011-08-20 ENCOUNTER — Other Ambulatory Visit: Payer: Self-pay | Admitting: Dermatology

## 2012-06-04 ENCOUNTER — Other Ambulatory Visit (HOSPITAL_COMMUNITY): Payer: Self-pay | Admitting: Internal Medicine

## 2012-06-04 DIAGNOSIS — N390 Urinary tract infection, site not specified: Secondary | ICD-10-CM

## 2012-06-04 DIAGNOSIS — J189 Pneumonia, unspecified organism: Secondary | ICD-10-CM

## 2012-06-05 ENCOUNTER — Other Ambulatory Visit (HOSPITAL_COMMUNITY): Payer: Self-pay | Admitting: Internal Medicine

## 2012-06-05 ENCOUNTER — Ambulatory Visit (HOSPITAL_COMMUNITY)
Admission: RE | Admit: 2012-06-05 | Discharge: 2012-06-05 | Disposition: A | Discharge status: Home / Self Care | Payer: PRIVATE HEALTH INSURANCE | Source: Ambulatory Visit | Attending: Internal Medicine | Admitting: Internal Medicine

## 2012-06-05 DIAGNOSIS — J189 Pneumonia, unspecified organism: Secondary | ICD-10-CM

## 2012-06-05 DIAGNOSIS — N390 Urinary tract infection, site not specified: Secondary | ICD-10-CM

## 2012-06-05 NOTE — Procedures (Signed)
Interventional Radiology Procedure Note  Procedure: Placement of right brachial vein PICC.  Tip at superior CAJ. Complications: None Recommendations: - Routine line care  Signed,  Sterling Big, MD Vascular & Interventional Radiologist [facility] Radiology

## 2012-06-18 DEATH — deceased

## 2012-12-16 ENCOUNTER — Telehealth: Payer: Self-pay | Admitting: Cardiovascular Disease

## 2012-12-16 NOTE — Telephone Encounter (Signed)
12-16-12 LMM @ 1143AM FOR PT CONTACT TO CALL TO SET UP DEVICE CHECK WITH Theodore Demark [facility] PT, OFFERED 12-24-12/MT

## 2013-07-28 ENCOUNTER — Encounter: Payer: Self-pay | Admitting: *Deleted

## 2013-07-29 ENCOUNTER — Telehealth: Payer: Self-pay | Admitting: Cardiology

## 2013-07-29 NOTE — Telephone Encounter (Signed)
Certified letter sent 

## 2013-08-24 ENCOUNTER — Encounter: Payer: Self-pay | Admitting: Cardiovascular Disease

## 2013-08-24 ENCOUNTER — Telehealth: Payer: Self-pay | Admitting: Cardiovascular Disease

## 2013-08-24 NOTE — Telephone Encounter (Signed)
PER PT'S DTR, PT DECEASED 2012/07/14/MT

## 2013-09-01 ENCOUNTER — Telehealth: Payer: Self-pay

## 2013-09-01 NOTE — Telephone Encounter (Signed)
Patient died @ [facility] per Obituary  °

## 2013-10-05 IMAGING — XA DG FLUORO GUIDE CV LINE
1 series · 3 of 3 positions shown · non-contrast
Comparison: none

CLINICAL DATA: May need for IV antibiotics

[Series 1: run · 3 of 3 slices shown]
[im 1/3]
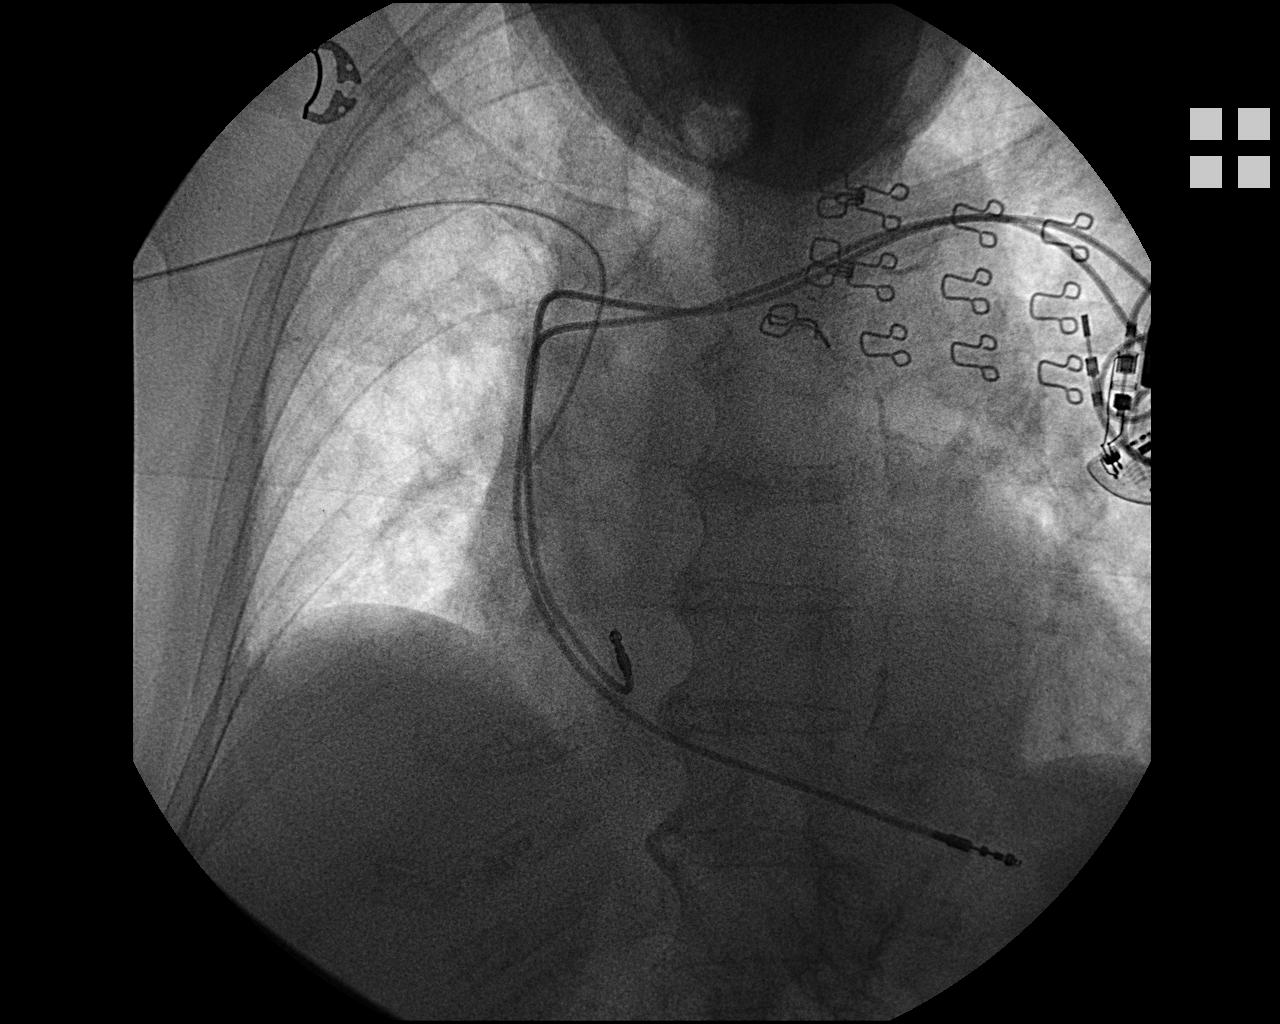
[im 2/3]
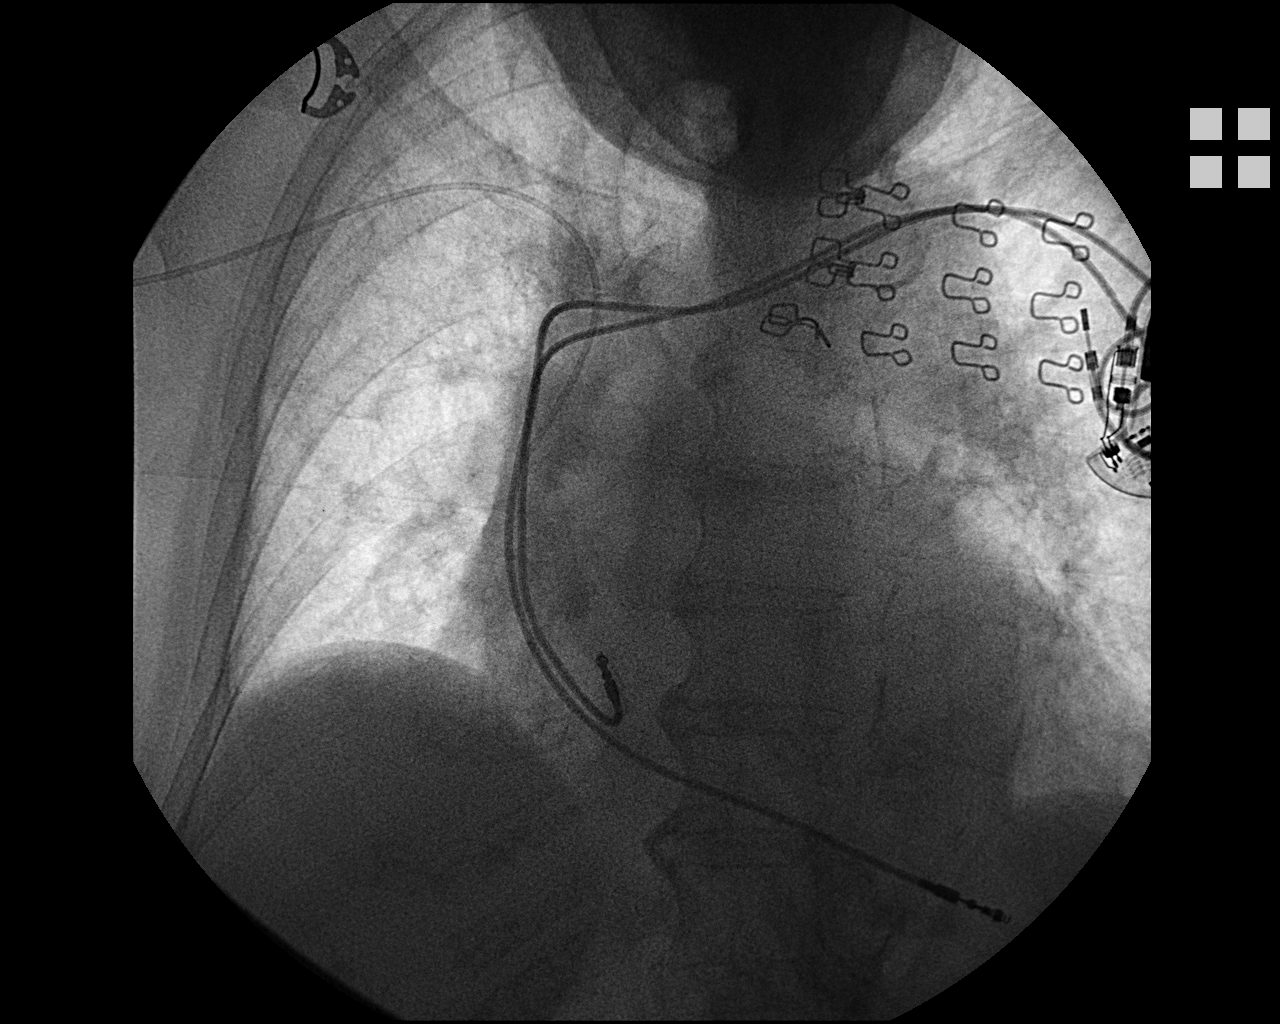
[im 3/3]
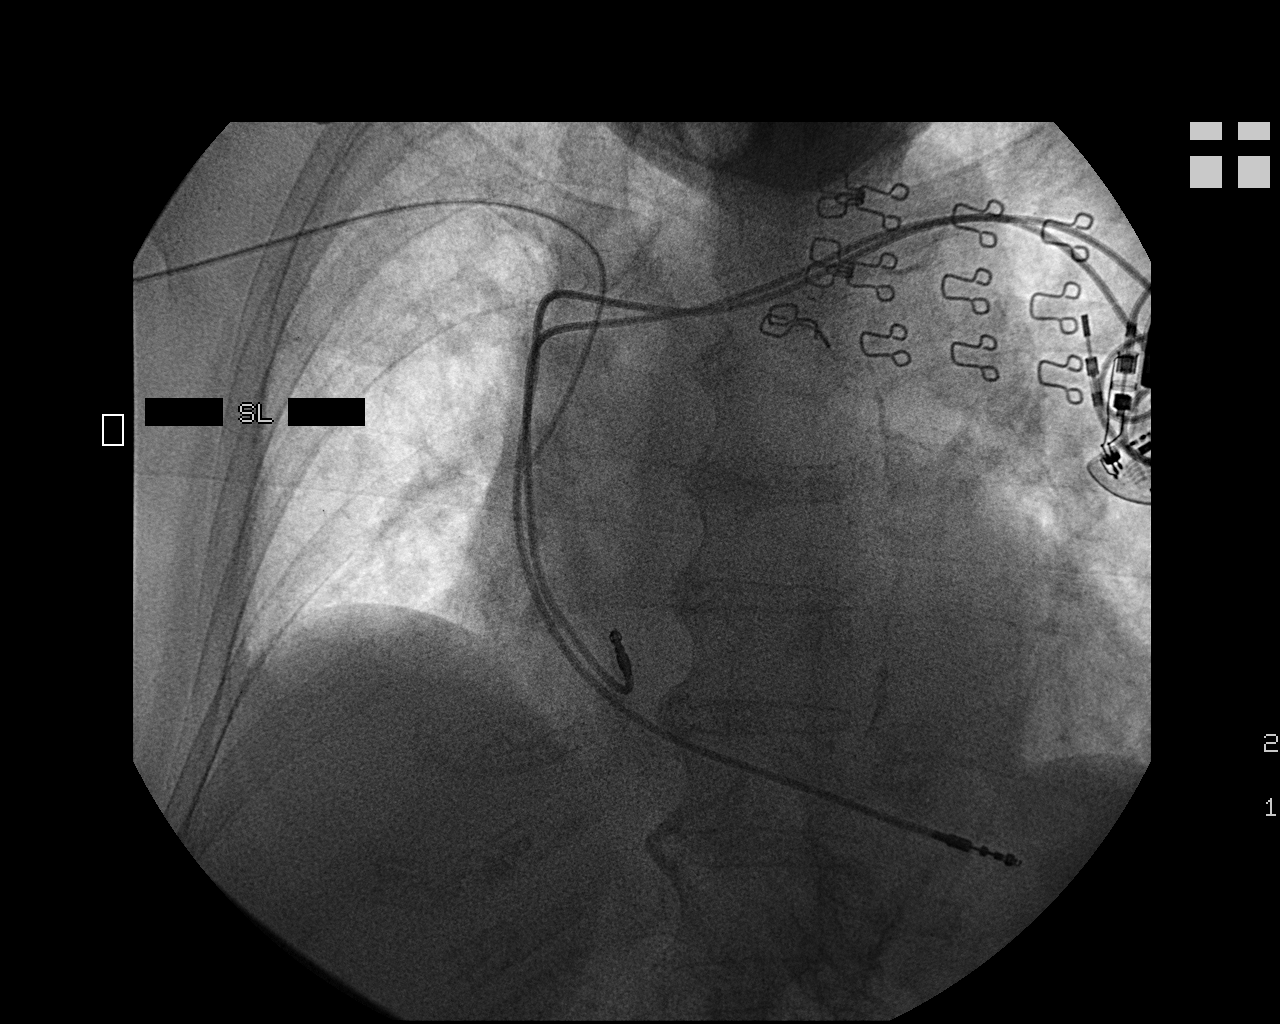

[3 of 3 positions shown; findings below may reference images not displayed]

PICC LINE PLACEMENT WITH ULTRASOUND AND FLUOROSCOPIC  GUIDANCE

Fluoroscopy Time: 0.5 minutes.

The right arm was prepped with chlorhexidine, draped in the usual
sterile fashion using maximum barrier technique (cap and mask,
sterile gown, sterile gloves, large sterile sheet, hand hygiene and
cutaneous antisepsis) and infiltrated locally with 1% Lidocaine.

Ultrasound demonstrated patency of the right brachial vein, and
this was documented with an image.  Under real-time ultrasound
guidance, this vein was accessed with a 21 gauge micropuncture
needle and image documentation was performed.  The needle was
exchanged over a guidewire for a peel-away sheath through which a
five French single lumen PICC trimmed to 37 cm was advanced,
positioned with its tip at the lower SVC/right atrial junction.
Fluoroscopy during the procedure and fluoro spot radiograph
confirms appropriate catheter position.  The catheter was flushed,
secured to the skin with Prolene sutures, and covered with a
sterile dressing.

Complications:  None
IMPRESSION: Successful right arm PICC line placement with ultrasound and
fluoroscopic guidance.  The catheter is ready for use.

Read by: Sain, Shi.-TIGER

## 2014-12-27 IMAGING — XA DG FLUORO GUIDE CV LINE
1 series · 2 of 2 positions shown · non-contrast
Comparison: none

PICC PLACEMENT WITH ULTRASOUND AND FLUOROSCOPIC  GUIDANCE
CLINICAL HISTORY: [AGE] female with urinary tract infection
in need of central venous access for outpatient IV antibiotics

[Series 1: run · 2 of 2 slices shown]
[im 1/2]
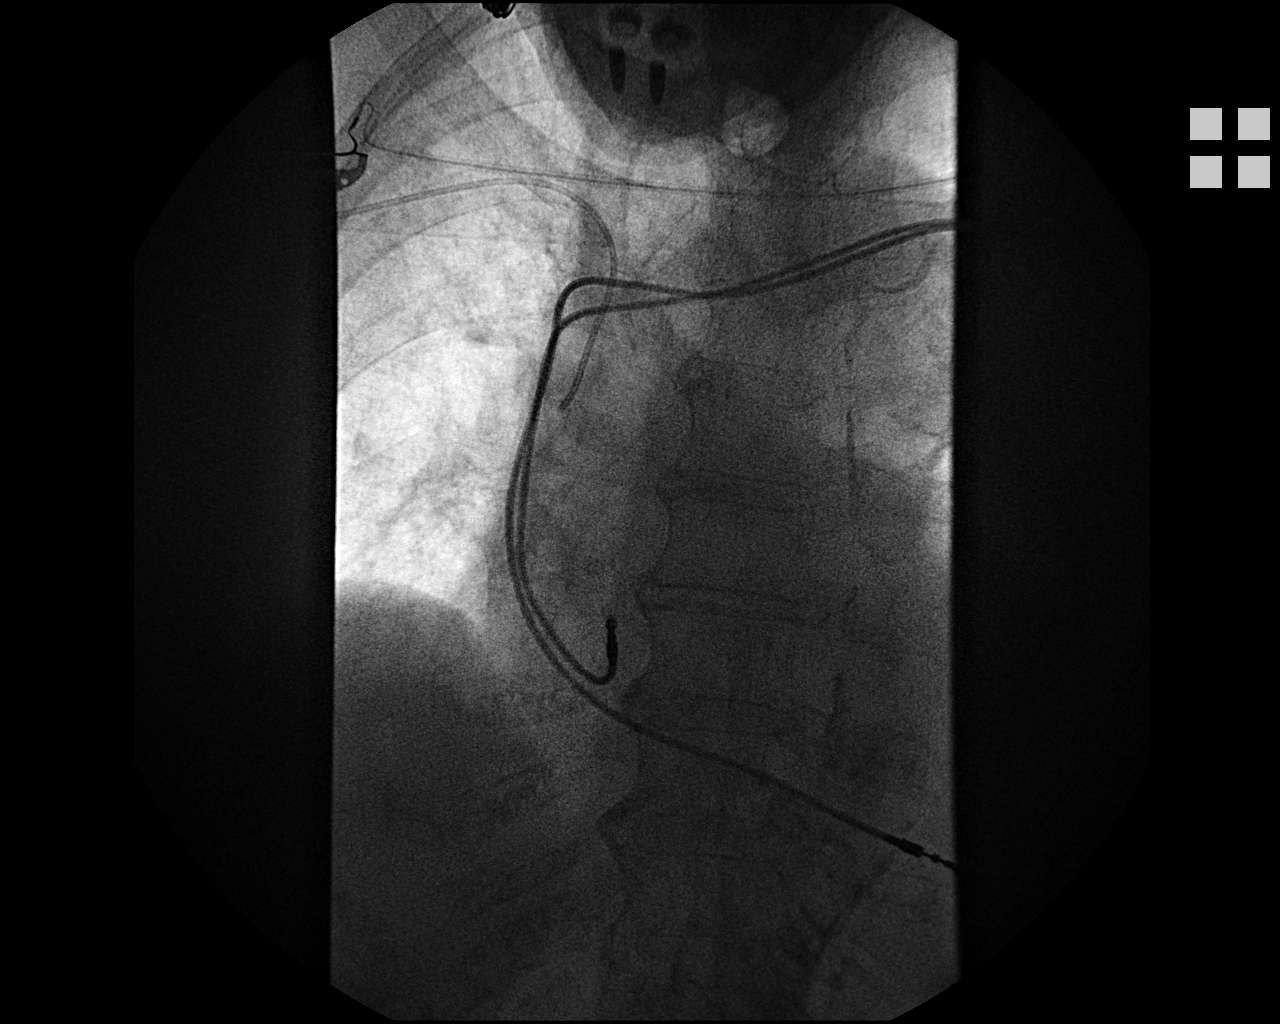
[im 2/2]
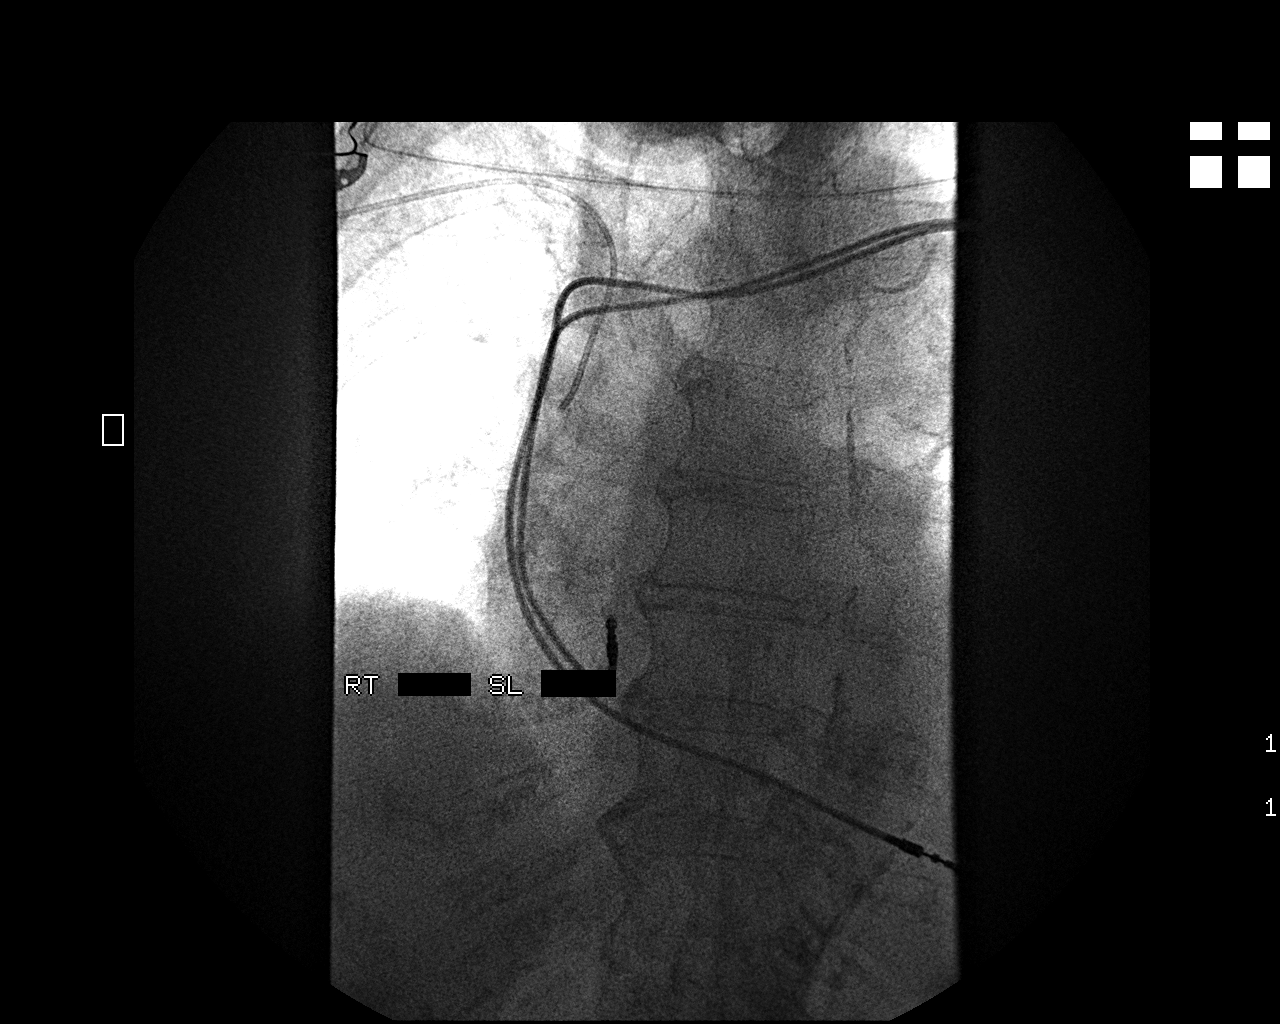

[2 of 2 positions shown; findings below may reference images not displayed]

Fluoroscopy Time: 0.2 minutes.

Procedure:

The right arm was prepped with chlorhexidine, draped in the usual
sterile fashion using maximum barrier technique (cap and mask,
sterile gown, sterile gloves, large sterile sheet, hand hygiene and
cutaneous antiseptic).  Local anesthesia was attained by
infiltration with 1% lidocaine.

Ultrasound demonstrated patency of the right basilic vein, and this
was documented with an image.  Under real-time ultrasound guidance,
this vein was accessed with a 21 gauge micropuncture needle and
image documentation was performed.  The needle was exchanged over a
guidewire for a peel-away sheath through which a 33 cm 5 French
single  lumen power injectable PICC was advanced, and positioned
with its tip at the mid superior vena cava.  Fluoroscopy during the
procedure and fluoro spot radiograph confirms appropriate catheter
position.  The catheter was flushed, secured to the skin with
Prolene sutures, and covered with a sterile dressing.

Complications:  None.  The patient tolerated the procedure well.
IMPRESSION: Successful placement of a right arm PICC with sonographic and
fluoroscopic guidance.  The catheter is ready for use.

[REDACTED]
